# Patient Record
Sex: Female | Born: 1990
Health system: Western US, Academic
[De-identification: ages and names within clinical notes are randomized; demographics above are authoritative.]

## PROBLEM LIST (undated history)

## (undated) DIAGNOSIS — N83209 Unspecified ovarian cyst, unspecified side: Secondary | ICD-10-CM

## (undated) DIAGNOSIS — T7840XA Allergy, unspecified, initial encounter: Secondary | ICD-10-CM

## (undated) HISTORY — PX: TONSILECTOMY, ADENOIDECTOMY, BILATERAL MYRINGOTOMY AND TUBES: SHX2538

## (undated) HISTORY — DX: Allergy, unspecified, initial encounter: T78.40XA

## (undated) HISTORY — DX: Unspecified ovarian cyst, unspecified side: N83.209

---
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
## 2010-02-11 NOTE — Assessment & Plan Note (Signed)
Summary: ? STREP//CCM   Vital Signs:  Patient profile:   20 year old female Menstrual status:  regular Temp:     98.8 degrees F oral Pulse rate:   80 / minute BP sitting:   104 / 60  (left arm) Cuff size:   regular  Vitals Entered By: Gladis Riffle, RN (March 05, 2009 2:18 PM) CC: c/o sore throat intermittent x 1 week, mother has strep Is Patient Diabetic? No Comments also cough, congestion and headache, but throat most worrisome   History of Present Illness: Michele Harding comesin today with mom because of above signs off and on for a week.   Awakens with scratchy sore throast that was betteer in the pm until past day when it became worse.  mom has strep by test  and   brother sick.     No congestion fever or inc ln.   no new rash   . No cough   no NVD   Preventive Screening-Counseling & Management  Alcohol-Tobacco     Alcohol drinks/day: <1     Alcohol type: vodka     Alcohol Counseling: Pt has only 2 nights of drinking in her life     Smoking Status: quit  Current Medications (verified): 1)  Proair Hfa 108 (90 Base) Mcg/act  Aers (Albuterol Sulfate) .Marland Kitchen.. 1-2 Puffs  Qid  As Needed or 1/2 Hour Pre Exercise 2)  Loestrin 24 Fe 1-20 Mg-Mcg Tabs (Norethin Ace-Eth Estrad-Fe) 3)  Fluoxetine Hcl 10 Mg Tabs (Fluoxetine Hcl) .... Take 1 Tablet By Mouth Once A Day  Allergies (verified): No Known Drug Allergies  Past History:  Past medical, surgical, family and social histories (including risk factors) reviewed for relevance to current acute and chronic problems.  Past Medical History: Reviewed history from 09/26/2008 and no changes required. acne EIA no  major injuries  eating problem  Consults Dr. Luanna Cole Dr. Eino Farber  Past Surgical History: Reviewed history from 07/01/2007 and no changes required. Adenoidectomy Tonsillectomy  Family History: Reviewed history from 02/27/2008 and no changes required. Family History of Depression mom  and grandparent  ?  no allergy s   Social History: Reviewed history from 09/26/2008 and no changes required. Not using alcohol Not using substances of abuse [place] [employer]  senioe sleep  7 hours minimal.    drinks milk  hhof 5     Review of Systems       The patient complains of anorexia.  The patient denies fever, weight gain, vision loss, decreased hearing, hoarseness, chest pain, syncope, dyspnea on exertion, prolonged cough, abdominal pain, melena, hematochezia, severe indigestion/heartburn, hematuria, difficulty walking, abnormal bleeding, enlarged lymph nodes, and angioedema.    Physical Exam  General:  Well-developed,well-nourished,in no acute distress; alert,appropriate and cooperative throughout examination washed out but non toxic  Head:  normocephalic and atraumatic.   Eyes:  vision grossly intact, pupils equal, and pupils round.   Ears:  R ear normal, L ear normal, and no external deformities.   Nose:  no external deformity and no external erythema.   Mouth:  very mild erythema and no edema and no exudate   Neck:  shoddy ac nodes but non tender  Lungs:  normal respiratory effort, no intercostal retractions, and no accessory muscle use.   Heart:  normal rate, regular rhythm, no murmur, and no gallop.   Abdomen:  soft and non-tender.   Pulses:  nl cap refill  Extremities:  no clubbing cyanosis or edema  Neurologic:  non focal  grossly Skin:  turgor normal, color normal, no ecchymoses, no petechiae, and no purpura.   Cervical Nodes:  shoddy no anterior cervical adenopathy and no posterior cervical adenopathy.   Psych:  Oriented X3, good eye contact, not anxious appearing, and not depressed appearing.     Impression & Recommendations:  Problem # 1:  PHARYNGITIS, ACUTE (ICD-462) unimpressive exam but high risk exposure  .. disc options or management .  The following medications were removed from the medication list:    Ciprofloxacin Hcl 500 Mg Tabs (Ciprofloxacin hcl) .Marland Kitchen... 1 by  mouth two times a day Her updated medication list for this problem includes:    Amoxicillin 500 Mg Caps (Amoxicillin) .Marland Kitchen... 1  by mouth two times a day for 10 days  Orders: Rapid Strep (21308) T-Culture, Throat (65784-69629)  Complete Medication List: 1)  Proair Hfa 108 (90 Base) Mcg/act Aers (Albuterol sulfate) .Marland Kitchen.. 1-2 puffs  qid  as needed or 1/2 hour pre exercise 2)  Loestrin 24 Fe 1-20 Mg-mcg Tabs (Norethin ace-eth estrad-fe) 3)  Fluoxetine Hcl 10 Mg Tabs (Fluoxetine hcl) .... Take 1 tablet by mouth once a day 4)  Amoxicillin 500 Mg Caps (Amoxicillin) .Marland Kitchen.. 1  by mouth two times a day for 10 days  Patient Instructions: 1)  will notify you of strep test when avaialbe. 2)  If fever increasing red throat and swollen gland in the neck than can start the antibiotic in the meantime. Prescriptions: AMOXICILLIN 500 MG CAPS (AMOXICILLIN) 1  by mouth two times a day for 10 days  #20 x 0   Entered and Authorized by:   Madelin Headings MD   Signed by:   Madelin Headings MD on 03/05/2009   Method used:   Print then Give to Patient   RxID:   [id number]   Laboratory Results    Other Tests  Rapid Strep: negative Comments: Rita Ohara  March 05, 2009 3:05 PM   Kit Test Internal QC: Negative   (Normal Range: Negative)

## 2010-02-11 NOTE — Assessment & Plan Note (Signed)
Summary: tdap, Benedict Needy and varivax/ssc  Nurse Visit   Allergies: No Known Drug Allergies  Immunizations Administered:  Tetanus Vaccine:    Vaccine Type: Tdap    Site: left deltoid    Mfr: GlaxoSmithKline    Dose: 0.5 ml    Route: IM    Given by: Romualdo Bolk, CMA (AAMA)    Exp. Date: 07/12/2011    Lot #: [id number]  Meningococcal Vaccine:    Vaccine Type: Menactra    Site: right deltoid    Mfr: Sanofi Pasteur    Dose: 0.5 ml    Route: IM    Given by: Romualdo Bolk, CMA (AAMA)    Exp. Date: 01/30/2011    Lot #: [id number]  Varicella Vaccine # 2:    Vaccine Type: Varicella    Site: right deltoid    Mfr: Merck    Dose: 0.5 ml    Route: [place]    Given by: Romualdo Bolk, CMA (AAMA)    Exp. Date: 11/14/2010    Lot #: [id number]  Orders Added: 1)  Tdap => 22yrs IM [90715] 2)  Menactra IM [90734] 3)  Admin 1st Vaccine [90471] 4)  Admin of Any Addtl Vaccine [90472] 5)  Varicella  [90716]

## 2010-03-29 LAB — URINE CULTURE: Colony Count: NO GROWTH

## 2010-03-29 LAB — POCT PREGNANCY, URINE: Preg Test, Ur: NEGATIVE

## 2010-03-29 LAB — POCT URINALYSIS DIP (DEVICE)
Bilirubin Urine: NEGATIVE
Ketones, ur: NEGATIVE mg/dL
Nitrite: NEGATIVE
Protein, ur: NEGATIVE mg/dL
pH: 5.5 (ref 5.0–8.0)

## 2010-05-30 NOTE — Op Note (Signed)
[place]. [facility]  Patient:    Michele Harding, Michele Harding                      MRN: [id number] Proc. Date: 06/27/99 Adm. Date:  [id number] Disc. Date: [id number] Attending:  Lucky Cowboy CC:         [facility], Nose and Throat             Dr. Linton Rump Ward             Dr. Lurena Joiner _________, [facility]                           Operative Report  PREOPERATIVE DIAGNOSIS:  Recurrent strep tonsillitis, obstructive sleep apnea.  POSTOPERATIVE DIAGNOSIS:  Recurrent strep tonsillitis, obstructive sleep apnea.  PROCEDURE:  Adenotonsillectomy.  SURGEON:  Lucky Cowboy, M.D.  ANESTHESIA:  General.  ESTIMATED BLOOD LOSS:  30 cc.  SPECIMENS:  Tonsils and adenoids.  COMPLICATIONS:  None.  INDICATIONS:  This patient is an 20-year-old female, who had recurrent tonsillitis for the past six years.  There have been approximately 5-6 episodes of strep tonsillitis per year for the last four years.  There is gagging with eating.  There is a poor energy level during the day.  There is some nasal congestion.  FINDINGS:  The patient was noted to have a moderately severe amount of adenoid hypertrophy with a moderate amount of obstruction of the posterior carina. There were 3+ bilateral palatine tonsils.  PROCEDURE:  The patient was taken to the operating room and placed on the table in the supine position.  She was then placed under general endotracheal anesthesia and the table rotated counterclockwise 90 degrees.  The neck was then gently extended using a shoulder roll.  Bacitracin ointment was placed on the lips.  The head and body were draped.  A Crowe-Davis mouthgag with a #3 tongue blade was then placed intraorally, opened and suspended on the Mayo stand.  Palpation of the soft palate was without evidence of a submucosal cleft. A red rubber catheter was placed down the right nostril, brought out through the oral cavity and secured in place with a hemostat.   Inspection of the nasopharynx was then performed using a mirror.  A medium sized adenoid curet was placed against the vomer, directed inferiorly thus severing the majority of the adenoid pad.  The remainder was removed using St. Clair-Thompson forceps again under indirect visualization.  Two sterile gauze Afrin-soaked packs were placed in the nasopharynx and time allowed for hemostasis.  The palate was relaxed and the right palatine tonsil removed using a Harmonic scalpel lateral to the tonsil in the peritonsillar space.  The left palatine tonsil was removed with a combination of ______  and due to some heating and malfunction of the Harmonic scalpel, the remainder of the inferior portion had to be resected using snare technique and cauterization.  The packs from the nasopharynx were removed and point hemostasis using suction cautery was performed.  An NG tube was then placed down the esophagus for suctioning of the gastric contents.  Mouthgag was removed, noting no damage to the teeth or soft tissues.  The table was rotated clockwise 90 degrees to its original position.  The patient was awakened from anesthesia and extubated in the operating room.  She was taken to the post anesthesia care unit in stable condition.  There were no complications. DD:  07/06/99 TD:  07/07/99 Job:  [id number]

## 2011-03-31 ENCOUNTER — Ambulatory Visit (INDEPENDENT_AMBULATORY_CARE_PROVIDER_SITE_OTHER): Payer: BC Managed Care – PPO | Admitting: Internal Medicine

## 2011-03-31 ENCOUNTER — Other Ambulatory Visit (INDEPENDENT_AMBULATORY_CARE_PROVIDER_SITE_OTHER): Payer: BC Managed Care – PPO

## 2011-03-31 ENCOUNTER — Encounter: Payer: Self-pay | Admitting: Internal Medicine

## 2011-03-31 VITALS — BP 100/70 | HR 84 | Temp 97.9°F | Resp 16 | Ht 66.0 in | Wt 122.0 lb

## 2011-03-31 DIAGNOSIS — H6120 Impacted cerumen, unspecified ear: Secondary | ICD-10-CM

## 2011-03-31 DIAGNOSIS — R5383 Other fatigue: Secondary | ICD-10-CM

## 2011-03-31 DIAGNOSIS — R5381 Other malaise: Secondary | ICD-10-CM

## 2011-03-31 DIAGNOSIS — F988 Other specified behavioral and emotional disorders with onset usually occurring in childhood and adolescence: Secondary | ICD-10-CM

## 2011-03-31 DIAGNOSIS — R1084 Generalized abdominal pain: Secondary | ICD-10-CM

## 2011-03-31 DIAGNOSIS — G478 Other sleep disorders: Secondary | ICD-10-CM

## 2011-03-31 DIAGNOSIS — G479 Sleep disorder, unspecified: Secondary | ICD-10-CM | POA: Insufficient documentation

## 2011-03-31 LAB — URINALYSIS
Leukocytes, UA: NEGATIVE
Nitrite: NEGATIVE
Specific Gravity, Urine: 1.02 (ref 1.000–1.030)
Urine Glucose: NEGATIVE
Urobilinogen, UA: 0.2 (ref 0.0–1.0)

## 2011-03-31 LAB — CBC WITH DIFFERENTIAL/PLATELET
Basophils Absolute: 0 10*3/uL (ref 0.0–0.1)
HCT: 38.1 % (ref 36.0–46.0)
Lymphs Abs: 1.9 10*3/uL (ref 0.7–4.0)
MCV: 91.3 fl (ref 78.0–100.0)
Monocytes Absolute: 0.4 10*3/uL (ref 0.1–1.0)
Platelets: 258 10*3/uL (ref 150.0–400.0)
RDW: 12.7 % (ref 11.5–14.6)

## 2011-03-31 MED ORDER — VITAMIN D 1000 UNITS PO TABS: 1000.0000 [IU] | ORAL_TABLET | Freq: Every day | ORAL | Status: AC

## 2011-03-31 MED ORDER — AMPHETAMINE-DEXTROAMPHETAMINE 10 MG PO TABS: 10.0000 mg | ORAL_TABLET | Freq: Two times a day (BID) | ORAL | Status: DC

## 2011-03-31 NOTE — Progress Notes (Signed)
  Subjective:    Patient ID: Michele Harding, female    DOB: 12-29-1990, 21 y.o.   MRN: [id number]  HPI  New patient C/o poor ability to focus, retain information, take tests. Grades are bad in spite of the effort. C/o sleeping up to 18 h/d x years C/o difficulty falling asleep at night; deep sleep and inability to wake up in am - she has been missing a lot of morning classes   Review of Systems  Constitutional: Negative for fever, chills, diaphoresis, activity change, appetite change, fatigue and unexpected weight change.  HENT: Negative for hearing loss, ear pain, nosebleeds, congestion, sore throat, sneezing, mouth sores, neck pain, dental problem, voice change, postnasal drip, sinus pressure and ear discharge.   Eyes: Negative for pain and visual disturbance.  Respiratory: Negative for cough, chest tightness, wheezing and stridor.   Cardiovascular: Negative for chest pain, palpitations and leg swelling.  Gastrointestinal: Negative for nausea, vomiting, abdominal pain, diarrhea, constipation, blood in stool, abdominal distention and rectal pain.  Genitourinary: Negative for dysuria, hematuria, decreased urine volume, vaginal bleeding, vaginal discharge, difficulty urinating, vaginal pain and menstrual problem.  Musculoskeletal: Negative for back pain, joint swelling and gait problem.  Skin: Negative for color change, pallor, rash and wound.  Neurological: Negative for dizziness, tremors, seizures, syncope, speech difficulty and light-headedness.  Hematological: Negative for adenopathy. Does not bruise/bleed easily.  Psychiatric/Behavioral: Negative for suicidal ideas, hallucinations, behavioral problems, confusion, sleep disturbance, self-injury, dysphoric mood, decreased concentration and agitation. The patient is not nervous/anxious and is not hyperactive.        Objective:   Physical Exam  Constitutional: She appears well-developed and well-nourished. No distress.  HENT:  Head:  Normocephalic.  Nose: Nose normal.  Mouth/Throat: Oropharynx is clear and moist.       B WAX  Eyes: Conjunctivae are normal. Pupils are equal, round, and reactive to light. Right eye exhibits no discharge. Left eye exhibits no discharge. No scleral icterus.  Neck: Normal range of motion. Neck supple. No JVD present. No tracheal deviation present. No thyromegaly present.  Cardiovascular: Normal rate, regular rhythm and normal heart sounds.   No murmur heard. Pulmonary/Chest: No stridor. No respiratory distress. She has no wheezes.  Abdominal: Soft. Bowel sounds are normal. She exhibits no distension and no mass. There is no tenderness. There is no rebound and no guarding.  Musculoskeletal: She exhibits no edema and no tenderness.  Lymphadenopathy:    She has no cervical adenopathy.  Neurological: She displays normal reflexes. No cranial nerve deficit. She exhibits normal muscle tone. Coordination normal.  Skin: No rash noted. She is not diaphoretic. No erythema.  Psychiatric: She has a normal mood and affect. Her behavior is normal. Judgment and thought content normal.     Procedure Note :     Procedure :  Ear irrigation   Indication:  Cerumen impaction   Risks, including pain, dizziness, eardrum perforation, bleeding, infection and others as well as benefits were explained to the patient in detail. Verbal consent was obtained and the patient agreed to proceed.    We used "The [employer] Device" field with lukewarm water for irrigation. A large amount wax was recovered. Procedure has also required manual wax removal with an ear loop.   Tolerated well. Complications: None.   Postprocedure instructions :  Call if problems.        Assessment & Plan:   Will get labs

## 2011-03-31 NOTE — Assessment & Plan Note (Signed)
Chronic  Hypersomnolence  - up to 18 h of sleep at night; problems falling asleep and problems waking up  Aderrall started  Sleep study will be planned later on

## 2011-03-31 NOTE — Assessment & Plan Note (Signed)
Mild - seems to be induced by wellbutrin OK to d/c Wellbutrin

## 2011-03-31 NOTE — Assessment & Plan Note (Signed)
Will irrigate 

## 2011-03-31 NOTE — Assessment & Plan Note (Signed)
Discussed w/pt Start Adderall

## 2011-04-01 ENCOUNTER — Telehealth: Payer: Self-pay | Admitting: Internal Medicine

## 2011-04-01 LAB — BASIC METABOLIC PANEL
Calcium: 9.2 mg/dL (ref 8.4–10.5)
GFR: 140.32 mL/min (ref >60.00)
Glucose, Bld: 86 mg/dL (ref 70–99)
Sodium: 138 mEq/L (ref 135–145)

## 2011-04-01 LAB — VITAMIN B12: Vitamin B-12: 331 pg/mL (ref 211–911)

## 2011-04-01 LAB — HEPATIC FUNCTION PANEL
Albumin: 4 g/dL (ref 3.5–5.2)
Alkaline Phosphatase: 38 U/L — ABNORMAL LOW (ref 39–117)

## 2011-04-01 MED ORDER — VITAMIN B-12 500 MCG SL SUBL: SUBLINGUAL_TABLET | SUBLINGUAL | Status: DC

## 2011-04-01 NOTE — Telephone Encounter (Signed)
Michele Harding, please, inform patient that all labs are normal except for a low end of nl Vit B12 Start Vit B12 sl B12 500 mcg/d Thx

## 2011-04-02 NOTE — Telephone Encounter (Signed)
Pt's mother Corrie Dandy informed.

## 2011-04-09 ENCOUNTER — Telehealth: Payer: Self-pay | Admitting: *Deleted

## 2011-04-09 NOTE — Telephone Encounter (Signed)
Received, completed & faxed back PA form for patient's Adderall 10 mg/SLS

## 2011-04-14 NOTE — Telephone Encounter (Signed)
PA for below is approved 03/19/11-04/08/12.

## 2011-06-01 ENCOUNTER — Ambulatory Visit (INDEPENDENT_AMBULATORY_CARE_PROVIDER_SITE_OTHER): Payer: BC Managed Care – PPO | Admitting: Obstetrics and Gynecology

## 2011-06-01 ENCOUNTER — Encounter: Payer: Self-pay | Admitting: Obstetrics and Gynecology

## 2011-06-01 VITALS — BP 106/60 | Ht 65.75 in | Wt 121.0 lb

## 2011-06-01 DIAGNOSIS — G479 Sleep disorder, unspecified: Secondary | ICD-10-CM

## 2011-06-01 DIAGNOSIS — G478 Other sleep disorders: Secondary | ICD-10-CM

## 2011-06-01 DIAGNOSIS — N926 Irregular menstruation, unspecified: Secondary | ICD-10-CM

## 2011-06-01 DIAGNOSIS — Z202 Contact with and (suspected) exposure to infections with a predominantly sexual mode of transmission: Secondary | ICD-10-CM

## 2011-06-01 MED ORDER — NORETHINDRONE-ETH ESTRADIOL 0.4-35 MG-MCG PO TABS: 1.0000 | ORAL_TABLET | Freq: Every day | ORAL | Status: DC
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
## 2011-06-01 NOTE — Progress Notes (Signed)
Addended by: Lerry Liner D on: 06/01/2011 10:49 AM   Modules accepted: Orders

## 2011-06-01 NOTE — Patient Instructions (Signed)
Oral Contraception Information Oral contraceptives (OCs) are medicines taken to prevent pregnancy, and sometimes to prevent irregular menstrual bleeding. OCs work by preventing the ovaries from releasing eggs. The hormones in OCs also cause the cervical mucus to thicken, preventing the sperm from entering the uterus. The hormones also cause the uterine lining to become thin, not allowing a fertilized egg to attach to the inside of the uterus. OCs are highly effective when taken exactly as prescribed. However, OCs do not prevent sexually transmitted diseases (STDs). Safe sex practices, such as using condoms along with the pill, can help prevent STDs.  Before taking the pill, you may have a physical exam and Pap test. Your caregiver may order blood tests that may be necessary. Your caregiver will make sure you are a good candidate for oral contraception. Discuss with your caregiver the possible side effects of the OC you may be prescribed. When starting an OC, it can take 2 to 3 months for the body to adjust to the changes in hormone levels in your body.  TYPES OF ORAL CONTRACEPTION  The combination pill. This pill contains estrogen and progestin (synthetic progesterone) hormones. The combination pill comes in either 21-day or 28-day packs. With 21-day packs, you do not take pills for 7 days after the last pill. With 28-day packs, the pill is taken every day. The last 7 pills are without hormones. Certain types of pills have more than 21 hormone-containing pills.   The minipill. This pill contains the progesterone hormone only. It is taken every day continuously. The minipill comes in packs of 91 pills. The first 84 pills contain the hormones, and the last 7 pills do not. The last 7 days are when you will have your menstrual period. You may experience irregular spotting.  ADVANTAGES  Decreases premenstrual symptoms.   Treats menstrual period cramps.   Regulates the menstrual cycle.   Decreases a heavy  menstrual flow.   Treats acne.   Treats abnormal uterine bleeding.   Treats chronic pelvic pain.   Treats polycystic ovarian syndrome.   Treats endometriosis.   Can be used as emergency contraception.  DISADVANTAGES OCs can be less effective if:  You forget to take the pill at the same time every day.   You have a stomach or intestinal disease that lessens the absorption of the pill.   You take OCs with other medicines that make OCs less effective.   You take expired OCs.   You forget to restart the pill on day 7, when using the packs of 21 pills.  Document Released: 03/21/2002 Document Revised: 12/18/2010 Document Reviewed: 05/07/2010 [facility] Patient Information 2012 [place], [place].

## 2011-06-02 LAB — GC/CHLAMYDIA PROBE AMP, GENITAL: Chlamydia, DNA Probe: NEGATIVE

## 2011-06-05 ENCOUNTER — Encounter: Payer: Self-pay | Admitting: Internal Medicine

## 2011-06-05 ENCOUNTER — Ambulatory Visit (INDEPENDENT_AMBULATORY_CARE_PROVIDER_SITE_OTHER): Payer: BC Managed Care – PPO | Admitting: Internal Medicine

## 2011-06-05 VITALS — BP 114/70 | HR 71 | Temp 98.5°F | Resp 16 | Wt 120.0 lb

## 2011-06-05 DIAGNOSIS — F988 Other specified behavioral and emotional disorders with onset usually occurring in childhood and adolescence: Secondary | ICD-10-CM

## 2011-06-05 DIAGNOSIS — R5383 Other fatigue: Secondary | ICD-10-CM

## 2011-06-05 DIAGNOSIS — G478 Other sleep disorders: Secondary | ICD-10-CM

## 2011-06-05 DIAGNOSIS — G479 Sleep disorder, unspecified: Secondary | ICD-10-CM

## 2011-06-05 DIAGNOSIS — R5381 Other malaise: Secondary | ICD-10-CM

## 2011-06-05 MED ORDER — AMPHETAMINE-DEXTROAMPHETAMINE 10 MG PO TABS: ORAL_TABLET | ORAL | Status: DC

## 2011-06-05 NOTE — Assessment & Plan Note (Signed)
Chronic  Hypersomnolence  - up to 18 h of sleep at night; problems falling asleep and problems waking up  06/01/11, now improved, doing well, sleeping 7-8/h per 24h

## 2011-06-05 NOTE — Assessment & Plan Note (Signed)
Better - will increase Adderall  Rx up to 30 mg/d

## 2011-06-05 NOTE — Assessment & Plan Note (Signed)
Much better 

## 2011-06-05 NOTE — Progress Notes (Signed)
Patient ID: Michele Harding, female   DOB: [birth year], 21 y.o.   MRN: [id number]  Subjective:    Patient ID: Michele Harding, female    DOB: 05/28/1990, 21 y.o.   MRN: [id number]  HPI   F/u poor ability to focus, retain information, take tests. Much better now F/u sleeping up to 18 h/d x years  - better now F/u difficulty falling asleep at night; deep sleep and inability to wake up in am  BP Readings from Last 3 Encounters:  06/05/11 114/70  06/01/11 106/60  03/31/11 100/70   Wt Readings from Last 3 Encounters:  06/05/11 120 lb (54.432 kg)  06/01/11 121 lb (54.885 kg)  03/31/11 122 lb (55.339 kg)      Review of Systems  Constitutional: Negative for fever, chills, diaphoresis, activity change, appetite change, fatigue and unexpected weight change.  HENT: Negative for hearing loss, ear pain, nosebleeds, congestion, sore throat, sneezing, mouth sores, neck pain, dental problem, voice change, postnasal drip, sinus pressure and ear discharge.   Eyes: Negative for pain and visual disturbance.  Respiratory: Negative for cough, chest tightness, wheezing and stridor.   Cardiovascular: Negative for chest pain, palpitations and leg swelling.  Gastrointestinal: Negative for nausea, vomiting, abdominal pain, diarrhea, constipation, blood in stool, abdominal distention and rectal pain.  Genitourinary: Negative for dysuria, hematuria, decreased urine volume, vaginal bleeding, vaginal discharge, difficulty urinating, vaginal pain and menstrual problem.  Musculoskeletal: Negative for back pain, joint swelling and gait problem.  Skin: Negative for color change, pallor, rash and wound.  Neurological: Negative for dizziness, tremors, seizures, syncope, speech difficulty and light-headedness.  Hematological: Negative for adenopathy. Does not bruise/bleed easily.  Psychiatric/Behavioral: Negative for suicidal ideas, hallucinations, behavioral problems, confusion, sleep disturbance, self-injury,  dysphoric mood, decreased concentration and agitation. The patient is not nervous/anxious and is not hyperactive.        Objective:   Physical Exam  Constitutional: She appears well-developed and well-nourished. No distress.  HENT:  Head: Normocephalic.  Nose: Nose normal.  Mouth/Throat: Oropharynx is clear and moist.  Eyes: Conjunctivae are normal. Pupils are equal, round, and reactive to light. Right eye exhibits no discharge. Left eye exhibits no discharge. No scleral icterus.  Neck: Normal range of motion. Neck supple. No JVD present. No tracheal deviation present. No thyromegaly present.  Cardiovascular: Normal rate, regular rhythm and normal heart sounds.   No murmur heard. Pulmonary/Chest: No stridor. No respiratory distress. She has no wheezes.  Abdominal: Soft. Bowel sounds are normal. She exhibits no distension and no mass. There is no tenderness. There is no rebound and no guarding.  Musculoskeletal: She exhibits no edema and no tenderness.  Lymphadenopathy:    She has no cervical adenopathy.  Neurological: She displays normal reflexes. No cranial nerve deficit. She exhibits normal muscle tone. Coordination normal.  Skin: No rash noted. She is not diaphoretic. No erythema.  Psychiatric: She has a normal mood and affect. Her behavior is normal. Judgment and thought content normal.      Lab Results  Component Value Date   WBC 6.3 03/31/2011   HGB 13.1 03/31/2011   HCT 38.1 03/31/2011   PLT 258.0 03/31/2011   GLUCOSE 86 03/31/2011   CHOL 166 03/23/2008   TRIG 70 03/23/2008   HDL 52.8 03/23/2008   LDLCALC 99 03/23/2008   ALT 15 03/31/2011   AST 16 03/31/2011   NA 138 03/31/2011   K 4.6 03/31/2011   CL 104 03/31/2011   CREATININE 0.6 03/31/2011   BUN 15  03/31/2011   CO2 27 03/31/2011   TSH 0.50 03/31/2011          Assessment & Plan:

## 2011-06-26 ENCOUNTER — Telehealth: Payer: Self-pay

## 2011-06-26 NOTE — Telephone Encounter (Signed)
Pt's father called requesting refill of Adderall for pt.

## 2011-06-29 NOTE — Telephone Encounter (Signed)
She was given two Rx  On 5/24th - one for June to fill on June 24 th and one for July to fill on Jul 24th. Does she have them? Thx

## 2011-06-30 NOTE — Telephone Encounter (Signed)
Called pt and father- no answer/unable to leave message.

## 2011-07-01 NOTE — Telephone Encounter (Signed)
I spoke to pt- she states she did lose the Rxs I advised her that Dr. Posey Rea is ok with replacing the Rxs.I advised her that I will get one of his partners to sign them this afternoon when I return to [place] office and call her mother.

## 2011-07-01 NOTE — Telephone Encounter (Signed)
I called and spoke to mother. She is unsure if pt lost them. She uses [facility] on [address] In [place] and wants me to contact them to see if the Rx is on file.

## 2011-07-02 MED ORDER — AMPHETAMINE-DEXTROAMPHETAMINE 10 MG PO TABS: ORAL_TABLET | ORAL | Status: DC

## 2011-07-02 NOTE — Telephone Encounter (Signed)
done

## 2011-07-02 NOTE — Telephone Encounter (Signed)
Pt's mother informed Rx upfront.

## 2011-08-04 ENCOUNTER — Ambulatory Visit (INDEPENDENT_AMBULATORY_CARE_PROVIDER_SITE_OTHER): Payer: BC Managed Care – PPO | Admitting: Internal Medicine

## 2011-08-04 ENCOUNTER — Encounter: Payer: Self-pay | Admitting: Internal Medicine

## 2011-08-04 VITALS — BP 118/78 | HR 80 | Temp 98.5°F | Resp 16 | Wt 122.0 lb

## 2011-08-04 DIAGNOSIS — R5383 Other fatigue: Secondary | ICD-10-CM

## 2011-08-04 DIAGNOSIS — G479 Sleep disorder, unspecified: Secondary | ICD-10-CM

## 2011-08-04 DIAGNOSIS — F988 Other specified behavioral and emotional disorders with onset usually occurring in childhood and adolescence: Secondary | ICD-10-CM

## 2011-08-04 DIAGNOSIS — R5381 Other malaise: Secondary | ICD-10-CM

## 2011-08-04 DIAGNOSIS — E559 Vitamin D deficiency, unspecified: Secondary | ICD-10-CM

## 2011-08-04 DIAGNOSIS — J45909 Unspecified asthma, uncomplicated: Secondary | ICD-10-CM | POA: Insufficient documentation

## 2011-08-04 DIAGNOSIS — E538 Deficiency of other specified B group vitamins: Secondary | ICD-10-CM

## 2011-08-04 MED ORDER — BUDESONIDE-FORMOTEROL FUMARATE 160-4.5 MCG/ACT IN AERO: 2.0000 | INHALATION_SPRAY | Freq: Two times a day (BID) | RESPIRATORY_TRACT | Status: AC

## 2011-08-04 MED ORDER — AMPHETAMINE-DEXTROAMPHETAMINE 10 MG PO TABS: ORAL_TABLET | ORAL | Status: DC

## 2011-08-04 MED ORDER — ALBUTEROL SULFATE HFA 108 (90 BASE) MCG/ACT IN AERS: 1.0000 | INHALATION_SPRAY | RESPIRATORY_TRACT | Status: DC | PRN

## 2011-08-04 NOTE — Assessment & Plan Note (Signed)
See meds 

## 2011-08-04 NOTE — Assessment & Plan Note (Signed)
Continue with current prescription therapy as reflected on the Med list. Labs  

## 2011-08-04 NOTE — Assessment & Plan Note (Signed)
Better Continue with current prescription therapy as reflected on the Med list.  

## 2011-08-04 NOTE — Assessment & Plan Note (Signed)
Continue with current prescription therapy as reflected on the Med list.  

## 2011-08-04 NOTE — Progress Notes (Signed)
Subjective:    Patient ID: Michele Harding, female    DOB: 1990-08-30, 20 y.o.   MRN: [id number]  HPI   F/u poor ability to focus, retain information, take tests. Much better now F/u sleep disorder (was sleeping 18 h/d) F/u difficulty falling asleep at night; deep sleep and inability to wake up in am F/u asthma, low B12 and low Vit D   BP Readings from Last 3 Encounters:  08/04/11 118/78  06/05/11 114/70  06/01/11 106/60   Wt Readings from Last 3 Encounters:  08/04/11 122 lb (55.339 kg)  06/05/11 120 lb (54.432 kg)  06/01/11 121 lb (54.885 kg)      Review of Systems  Constitutional: Negative for fever, chills, diaphoresis, activity change, appetite change, fatigue and unexpected weight change.  HENT: Negative for hearing loss, ear pain, nosebleeds, congestion, sore throat, sneezing, mouth sores, neck pain, dental problem, voice change, postnasal drip, sinus pressure and ear discharge.   Eyes: Negative for pain and visual disturbance.  Respiratory: Negative for cough, chest tightness, wheezing and stridor.   Cardiovascular: Negative for chest pain, palpitations and leg swelling.  Gastrointestinal: Negative for nausea, vomiting, abdominal pain, diarrhea, constipation, blood in stool, abdominal distention and rectal pain.  Genitourinary: Negative for dysuria, hematuria, decreased urine volume, vaginal bleeding, vaginal discharge, difficulty urinating, vaginal pain and menstrual problem.  Musculoskeletal: Negative for back pain, joint swelling and gait problem.  Skin: Negative for color change, pallor, rash and wound.  Neurological: Negative for dizziness, tremors, seizures, syncope, speech difficulty and light-headedness.  Hematological: Negative for adenopathy. Does not bruise/bleed easily.  Psychiatric/Behavioral: Negative for suicidal ideas, hallucinations, behavioral problems, confusion, disturbed wake/sleep cycle, self-injury, dysphoric mood, decreased concentration and  agitation. The patient is not nervous/anxious and is not hyperactive.        Objective:   Physical Exam  Constitutional: She appears well-developed and well-nourished. No distress.  HENT:  Head: Normocephalic.  Nose: Nose normal.  Mouth/Throat: Oropharynx is clear and moist.  Eyes: Conjunctivae are normal. Pupils are equal, round, and reactive to light. Right eye exhibits no discharge. Left eye exhibits no discharge. No scleral icterus.  Neck: Normal range of motion. Neck supple. No JVD present. No tracheal deviation present. No thyromegaly present.  Cardiovascular: Normal rate, regular rhythm and normal heart sounds.   No murmur heard. Pulmonary/Chest: No stridor. No respiratory distress. She has no wheezes.  Abdominal: Soft. Bowel sounds are normal. She exhibits no distension and no mass. There is no tenderness. There is no rebound and no guarding.  Musculoskeletal: She exhibits no edema and no tenderness.  Lymphadenopathy:    She has no cervical adenopathy.  Neurological: She displays normal reflexes. No cranial nerve deficit. She exhibits normal muscle tone. Coordination normal.  Skin: No rash noted. She is not diaphoretic. No erythema.  Psychiatric: She has a normal mood and affect. Her behavior is normal. Judgment and thought content normal.      Lab Results  Component Value Date   WBC 6.3 03/31/2011   HGB 13.1 03/31/2011   HCT 38.1 03/31/2011   PLT 258.0 03/31/2011   GLUCOSE 86 03/31/2011   CHOL 166 03/23/2008   TRIG 70 03/23/2008   HDL 52.8 03/23/2008   LDLCALC 99 03/23/2008   ALT 15 03/31/2011   AST 16 03/31/2011   NA 138 03/31/2011   K 4.6 03/31/2011   CL 104 03/31/2011   CREATININE 0.6 03/31/2011   BUN 15 03/31/2011   CO2 27 03/31/2011   TSH 0.50 03/31/2011  Assessment & Plan:

## 2011-08-04 NOTE — Assessment & Plan Note (Signed)
Doing well 

## 2011-08-04 NOTE — Assessment & Plan Note (Signed)
Better  

## 2011-09-15 ENCOUNTER — Ambulatory Visit: Payer: BC Managed Care – PPO | Admitting: Internal Medicine

## 2011-10-21 ENCOUNTER — Ambulatory Visit (INDEPENDENT_AMBULATORY_CARE_PROVIDER_SITE_OTHER): Payer: BC Managed Care – PPO | Admitting: Internal Medicine

## 2011-10-21 ENCOUNTER — Encounter: Payer: Self-pay | Admitting: Internal Medicine

## 2011-10-21 ENCOUNTER — Other Ambulatory Visit (INDEPENDENT_AMBULATORY_CARE_PROVIDER_SITE_OTHER): Payer: BC Managed Care – PPO

## 2011-10-21 VITALS — BP 100/80 | HR 80 | Temp 98.5°F | Resp 16 | Wt 125.1 lb

## 2011-10-21 DIAGNOSIS — Z23 Encounter for immunization: Secondary | ICD-10-CM

## 2011-10-21 DIAGNOSIS — J45909 Unspecified asthma, uncomplicated: Secondary | ICD-10-CM

## 2011-10-21 DIAGNOSIS — E538 Deficiency of other specified B group vitamins: Secondary | ICD-10-CM

## 2011-10-21 DIAGNOSIS — Z733 Stress, not elsewhere classified: Secondary | ICD-10-CM

## 2011-10-21 DIAGNOSIS — E559 Vitamin D deficiency, unspecified: Secondary | ICD-10-CM

## 2011-10-21 DIAGNOSIS — F419 Anxiety disorder, unspecified: Secondary | ICD-10-CM

## 2011-10-21 DIAGNOSIS — F411 Generalized anxiety disorder: Secondary | ICD-10-CM

## 2011-10-21 DIAGNOSIS — F988 Other specified behavioral and emotional disorders with onset usually occurring in childhood and adolescence: Secondary | ICD-10-CM

## 2011-10-21 DIAGNOSIS — F439 Reaction to severe stress, unspecified: Secondary | ICD-10-CM

## 2011-10-21 DIAGNOSIS — G479 Sleep disorder, unspecified: Secondary | ICD-10-CM

## 2011-10-21 MED ORDER — AMPHETAMINE-DEXTROAMPHETAMINE 10 MG PO TABS: ORAL_TABLET | ORAL | Status: DC

## 2011-10-21 MED ORDER — ALBUTEROL SULFATE HFA 108 (90 BASE) MCG/ACT IN AERS: 1.0000 | INHALATION_SPRAY | RESPIRATORY_TRACT | Status: DC | PRN

## 2011-10-21 MED ORDER — VITAMIN B-12 500 MCG SL SUBL: SUBLINGUAL_TABLET | SUBLINGUAL | Status: AC

## 2011-10-21 MED ORDER — BUSPIRONE HCL 15 MG PO TABS: 15.0000 mg | ORAL_TABLET | Freq: Two times a day (BID) | ORAL | Status: DC

## 2011-10-21 NOTE — Progress Notes (Signed)
Subjective:    Patient ID: Michele Harding, female    DOB: 07/28/1990, 21 y.o.   MRN: [id number]  HPI  C/o anxiety and stress - her parents have moved to [place] and she was working too much - she dropped out...- will re-start in Jan F/u poor ability to focus, retain information, take tests. Much better now F/u sleep disorder (she used to be sleeping 18 h/d) -much better F/u difficulty falling asleep at night; deep sleep and inability to wake up in am F/u asthma, low B12 and low Vit D   BP Readings from Last 3 Encounters:  10/21/11 100/80  08/04/11 118/78  06/05/11 114/70   Wt Readings from Last 3 Encounters:  10/21/11 125 lb 2 oz (56.756 kg)  08/04/11 122 lb (55.339 kg)  06/05/11 120 lb (54.432 kg)      Review of Systems  Constitutional: Negative for fever, chills, diaphoresis, activity change, appetite change, fatigue and unexpected weight change.  HENT: Negative for hearing loss, ear pain, nosebleeds, congestion, sore throat, sneezing, mouth sores, neck pain, dental problem, voice change, postnasal drip, sinus pressure and ear discharge.   Eyes: Negative for pain and visual disturbance.  Respiratory: Negative for cough, chest tightness, wheezing and stridor.   Cardiovascular: Negative for chest pain, palpitations and leg swelling.  Gastrointestinal: Negative for nausea, vomiting, abdominal pain, diarrhea, constipation, blood in stool, abdominal distention and rectal pain.  Genitourinary: Negative for dysuria, hematuria, decreased urine volume, vaginal bleeding, vaginal discharge, difficulty urinating, vaginal pain and menstrual problem.  Musculoskeletal: Negative for back pain, joint swelling and gait problem.  Skin: Negative for color change, pallor, rash and wound.  Neurological: Negative for dizziness, tremors, seizures, syncope, speech difficulty and light-headedness.  Hematological: Negative for adenopathy. Does not bruise/bleed easily.  Psychiatric/Behavioral:  Negative for suicidal ideas, hallucinations, behavioral problems, confusion, disturbed wake/sleep cycle, self-injury, dysphoric mood, decreased concentration and agitation. The patient is not nervous/anxious and is not hyperactive.        Objective:   Physical Exam  Constitutional: She appears well-developed and well-nourished. No distress.  HENT:  Head: Normocephalic.  Nose: Nose normal.  Mouth/Throat: Oropharynx is clear and moist.  Eyes: Conjunctivae normal are normal. Pupils are equal, round, and reactive to light. Right eye exhibits no discharge. Left eye exhibits no discharge. No scleral icterus.  Neck: Normal range of motion. Neck supple. No JVD present. No tracheal deviation present. No thyromegaly present.  Cardiovascular: Normal rate, regular rhythm and normal heart sounds.   No murmur heard. Pulmonary/Chest: No stridor. No respiratory distress. She has no wheezes.  Abdominal: Soft. Bowel sounds are normal. She exhibits no distension and no mass. There is no tenderness. There is no rebound and no guarding.  Musculoskeletal: She exhibits no edema and no tenderness.  Lymphadenopathy:    She has no cervical adenopathy.  Neurological: She displays normal reflexes. No cranial nerve deficit. She exhibits normal muscle tone. Coordination normal.  Skin: No rash noted. She is not diaphoretic. No erythema.  Psychiatric: She has a normal mood and affect. Her behavior is normal. Judgment and thought content normal.      Lab Results  Component Value Date   WBC 6.3 03/31/2011   HGB 13.1 03/31/2011   HCT 38.1 03/31/2011   PLT 258.0 03/31/2011   GLUCOSE 86 03/31/2011   CHOL 166 03/23/2008   TRIG 70 03/23/2008   HDL 52.8 03/23/2008   LDLCALC 99 03/23/2008   ALT 15 03/31/2011   AST 16 03/31/2011   NA  138 03/31/2011   K 4.6 03/31/2011   CL 104 03/31/2011   CREATININE 0.6 03/31/2011   BUN 15 03/31/2011   CO2 27 03/31/2011   TSH 0.50 03/31/2011          Assessment & Plan:

## 2011-10-21 NOTE — Assessment & Plan Note (Signed)
Continue with current prescription therapy as reflected on the Med list.  

## 2011-10-21 NOTE — Assessment & Plan Note (Signed)
Allergy tests 

## 2011-10-22 DIAGNOSIS — F419 Anxiety disorder, unspecified: Secondary | ICD-10-CM | POA: Insufficient documentation

## 2011-10-22 DIAGNOSIS — F439 Reaction to severe stress, unspecified: Secondary | ICD-10-CM | POA: Insufficient documentation

## 2011-10-22 LAB — ALLERGEN FOOD PROFILE SPECIFIC IGE
Apple: <0.1 kU/L
Chicken IgE: <0.1 kU/L
Corn: <0.1 kU/L
Egg White IgE: <0.1 kU/L
Fish Cod: <0.1 kU/L
IgE (Immunoglobulin E), Serum: 4.6 IU/mL (ref 0.0–180.0)
Milk IgE: <0.1 kU/L
Orange: <0.1 kU/L
Peanut IgE: <0.1 kU/L
Shrimp IgE: <0.1 kU/L
Soybean IgE: <0.1 kU/L
Tomato IgE: <0.1 kU/L
Tuna IgE: <0.1 kU/L
Wheat IgE: <0.1 kU/L

## 2011-10-22 LAB — VITAMIN D 25 HYDROXY (VIT D DEFICIENCY, FRACTURES): Vit D, 25-Hydroxy: 60 ng/mL (ref 30–89)

## 2011-10-22 LAB — VITAMIN B12: Vitamin B-12: 548 pg/mL (ref 211–911)

## 2011-10-22 NOTE — Assessment & Plan Note (Signed)
Discussed. She is seeing a Veterinary surgeon.

## 2011-10-22 NOTE — Assessment & Plan Note (Signed)
Buspar  bid 

## 2011-10-27 LAB — IGG FOOD PANEL
Beef, IgG: 6.1 ug/mL — ABNORMAL HIGH (ref <2.0)
Egg yolk, IgG: <2 ug/mL (ref <2.0)
Wheat, IgG: 0.18 ug/mL — ABNORMAL HIGH (ref <0.15)

## 2012-01-25 ENCOUNTER — Ambulatory Visit: Payer: BC Managed Care – PPO | Admitting: Internal Medicine

## 2012-01-27 ENCOUNTER — Ambulatory Visit: Payer: BC Managed Care – PPO | Admitting: Internal Medicine

## 2012-02-04 ENCOUNTER — Ambulatory Visit: Payer: BC Managed Care – PPO | Admitting: Internal Medicine

## 2012-02-09 ENCOUNTER — Ambulatory Visit: Payer: BC Managed Care – PPO | Admitting: Internal Medicine

## 2012-02-18 ENCOUNTER — Encounter: Payer: Self-pay | Admitting: Internal Medicine

## 2012-02-18 ENCOUNTER — Ambulatory Visit (INDEPENDENT_AMBULATORY_CARE_PROVIDER_SITE_OTHER): Payer: BC Managed Care – PPO | Admitting: Internal Medicine

## 2012-02-18 VITALS — BP 118/80 | HR 88 | Temp 98.3°F | Resp 16 | Wt 118.0 lb

## 2012-02-18 DIAGNOSIS — F411 Generalized anxiety disorder: Secondary | ICD-10-CM

## 2012-02-18 DIAGNOSIS — F439 Reaction to severe stress, unspecified: Secondary | ICD-10-CM

## 2012-02-18 DIAGNOSIS — E538 Deficiency of other specified B group vitamins: Secondary | ICD-10-CM

## 2012-02-18 DIAGNOSIS — F419 Anxiety disorder, unspecified: Secondary | ICD-10-CM

## 2012-02-18 DIAGNOSIS — F988 Other specified behavioral and emotional disorders with onset usually occurring in childhood and adolescence: Secondary | ICD-10-CM

## 2012-02-18 DIAGNOSIS — Z733 Stress, not elsewhere classified: Secondary | ICD-10-CM

## 2012-02-18 MED ORDER — AMPHETAMINE-DEXTROAMPHET ER 20 MG PO CP24: 20.0000 mg | ORAL_CAPSULE | ORAL | Status: DC

## 2012-02-18 MED ORDER — AMPHETAMINE-DEXTROAMPHETAMINE 10 MG PO TABS: ORAL_TABLET | ORAL | Status: DC

## 2012-02-18 MED ORDER — BUSPIRONE HCL 15 MG PO TABS: 15.0000 mg | ORAL_TABLET | Freq: Two times a day (BID) | ORAL | Status: DC

## 2012-02-18 NOTE — Assessment & Plan Note (Signed)
Change to Adderral XR in am and reg Adderral at lunch ( if needed only) to avoid a "fatigue gap"

## 2012-02-18 NOTE — Progress Notes (Signed)
Subjective:    HPI  C/o anxiety and stress. She re-start in Jan F/u poor ability to focus, retain information, take tests. Much better now. C/o a profound fatigue gap after lunch x 1-2 hrs long... F/u sleep disorder (she used to be sleeping 18 h/d) -much better F/u difficulty falling asleep at night; deep sleep and inability to wake up in am F/u asthma, low B12 and low Vit D   BP Readings from Last 3 Encounters:  02/18/12 118/80  10/21/11 100/80  08/04/11 118/78   Wt Readings from Last 3 Encounters:  02/18/12 118 lb (53.524 kg)  10/21/11 125 lb 2 oz (56.756 kg)  08/04/11 122 lb (55.339 kg)      Review of Systems  Constitutional: Negative for fever, chills, diaphoresis, activity change, appetite change, fatigue and unexpected weight change.  HENT: Negative for hearing loss, ear pain, nosebleeds, congestion, sore throat, sneezing, mouth sores, neck pain, dental problem, voice change, postnasal drip, sinus pressure and ear discharge.   Eyes: Negative for pain and visual disturbance.  Respiratory: Negative for cough, chest tightness, wheezing and stridor.   Cardiovascular: Negative for chest pain, palpitations and leg swelling.  Gastrointestinal: Negative for nausea, vomiting, abdominal pain, diarrhea, constipation, blood in stool, abdominal distention and rectal pain.  Genitourinary: Negative for dysuria, hematuria, decreased urine volume, vaginal bleeding, vaginal discharge, difficulty urinating, vaginal pain and menstrual problem.  Musculoskeletal: Negative for back pain, joint swelling and gait problem.  Skin: Negative for color change, pallor, rash and wound.  Neurological: Negative for dizziness, tremors, seizures, syncope, speech difficulty and light-headedness.  Hematological: Negative for adenopathy. Does not bruise/bleed easily.  Psychiatric/Behavioral: Negative for suicidal ideas, hallucinations, behavioral problems, confusion, sleep disturbance, self-injury, dysphoric  mood, decreased concentration and agitation. The patient is not nervous/anxious and is not hyperactive.        Objective:   Physical Exam  Constitutional: She appears well-developed and well-nourished. No distress.  HENT:  Head: Normocephalic.  Nose: Nose normal.  Mouth/Throat: Oropharynx is clear and moist.  Eyes: Conjunctivae normal are normal. Pupils are equal, round, and reactive to light. Right eye exhibits no discharge. Left eye exhibits no discharge. No scleral icterus.  Neck: Normal range of motion. Neck supple. No JVD present. No tracheal deviation present. No thyromegaly present.  Cardiovascular: Normal rate, regular rhythm and normal heart sounds.   No murmur heard. Pulmonary/Chest: No stridor. No respiratory distress. She has no wheezes.  Abdominal: Soft. Bowel sounds are normal. She exhibits no distension and no mass. There is no tenderness. There is no rebound and no guarding.  Musculoskeletal: She exhibits no edema and no tenderness.  Lymphadenopathy:    She has no cervical adenopathy.  Neurological: She displays normal reflexes. No cranial nerve deficit. She exhibits normal muscle tone. Coordination normal.  Skin: No rash noted. She is not diaphoretic. No erythema.  Psychiatric: She has a normal mood and affect. Her behavior is normal. Judgment and thought content normal.      Lab Results  Component Value Date   WBC 6.3 03/31/2011   HGB 13.1 03/31/2011   HCT 38.1 03/31/2011   PLT 258.0 03/31/2011   GLUCOSE 86 03/31/2011   CHOL 166 03/23/2008   TRIG 70 03/23/2008   HDL 52.8 03/23/2008   LDLCALC 99 03/23/2008   ALT 15 03/31/2011   AST 16 03/31/2011   NA 138 03/31/2011   K 4.6 03/31/2011   CL 104 03/31/2011   CREATININE 0.6 03/31/2011   BUN 15 03/31/2011   CO2  27 03/31/2011   TSH 0.50 03/31/2011          Assessment & Plan:

## 2012-02-18 NOTE — Assessment & Plan Note (Signed)
Better She re-started school Working very little this semester

## 2012-02-18 NOTE — Assessment & Plan Note (Signed)
Continue with current prescription therapy as reflected on the Med list.  

## 2012-02-18 NOTE — Assessment & Plan Note (Signed)
Better Continue with current prescription therapy as reflected on the Med list.  
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
## 2012-05-19 ENCOUNTER — Ambulatory Visit: Payer: BC Managed Care – PPO | Admitting: Internal Medicine

## 2012-05-27 ENCOUNTER — Encounter: Payer: Self-pay | Admitting: Internal Medicine

## 2012-05-27 ENCOUNTER — Ambulatory Visit (INDEPENDENT_AMBULATORY_CARE_PROVIDER_SITE_OTHER): Payer: BC Managed Care – PPO | Admitting: Internal Medicine

## 2012-05-27 ENCOUNTER — Ambulatory Visit: Payer: BC Managed Care – PPO | Admitting: Internal Medicine

## 2012-05-27 VITALS — BP 110/80 | HR 80 | Temp 98.6°F | Resp 16 | Wt 116.0 lb

## 2012-05-27 DIAGNOSIS — F988 Other specified behavioral and emotional disorders with onset usually occurring in childhood and adolescence: Secondary | ICD-10-CM

## 2012-05-27 DIAGNOSIS — E559 Vitamin D deficiency, unspecified: Secondary | ICD-10-CM

## 2012-05-27 DIAGNOSIS — E538 Deficiency of other specified B group vitamins: Secondary | ICD-10-CM

## 2012-05-27 MED ORDER — AMPHETAMINE-DEXTROAMPHETAMINE 10 MG PO TABS: ORAL_TABLET | ORAL | Status: DC

## 2012-05-27 MED ORDER — NORETHINDRONE-ETH ESTRADIOL 0.4-35 MG-MCG PO TABS: 1.0000 | ORAL_TABLET | Freq: Every day | ORAL | Status: AC

## 2012-05-27 NOTE — Progress Notes (Signed)
Subjective:    HPI  F/u ADD F/u anxiety and stress - better F/u poor ability to focus, retain information, take tests. Much better now.  F/u sleep disorder (she used to be sleeping 18 h/d) -much better F/u difficulty falling asleep at night; deep sleep and inability to wake up in am F/u asthma, low B12 and low Vit D - doing ok   BP Readings from Last 3 Encounters:  05/27/12 110/80  02/18/12 118/80  10/21/11 100/80   Wt Readings from Last 3 Encounters:  05/27/12 116 lb (52.617 kg)  02/18/12 118 lb (53.524 kg)  10/21/11 125 lb 2 oz (56.756 kg)      Review of Systems  Constitutional: Negative for fever, chills, diaphoresis, activity change, appetite change, fatigue and unexpected weight change.  HENT: Negative for hearing loss, ear pain, nosebleeds, congestion, sore throat, sneezing, mouth sores, neck pain, dental problem, voice change, postnasal drip, sinus pressure and ear discharge.   Eyes: Negative for pain and visual disturbance.  Respiratory: Negative for cough, chest tightness, wheezing and stridor.   Cardiovascular: Negative for chest pain, palpitations and leg swelling.  Gastrointestinal: Negative for nausea, vomiting, abdominal pain, diarrhea, constipation, blood in stool, abdominal distention and rectal pain.  Genitourinary: Negative for dysuria, hematuria, decreased urine volume, vaginal bleeding, vaginal discharge, difficulty urinating, vaginal pain and menstrual problem.  Musculoskeletal: Negative for back pain, joint swelling and gait problem.  Skin: Negative for color change, pallor, rash and wound.  Neurological: Negative for dizziness, tremors, seizures, syncope, speech difficulty and light-headedness.  Hematological: Negative for adenopathy. Does not bruise/bleed easily.  Psychiatric/Behavioral: Negative for suicidal ideas, hallucinations, behavioral problems, confusion, sleep disturbance, self-injury, dysphoric mood, decreased concentration and agitation.  The patient is not nervous/anxious and is not hyperactive.        Objective:   Physical Exam  Constitutional: She appears well-developed and well-nourished. No distress.  HENT:  Head: Normocephalic.  Nose: Nose normal.  Mouth/Throat: Oropharynx is clear and moist.  Eyes: Conjunctivae are normal. Pupils are equal, round, and reactive to light. Right eye exhibits no discharge. Left eye exhibits no discharge. No scleral icterus.  Neck: Normal range of motion. Neck supple. No JVD present. No tracheal deviation present. No thyromegaly present.  Cardiovascular: Normal rate, regular rhythm and normal heart sounds.   No murmur heard. Pulmonary/Chest: No stridor. No respiratory distress. She has no wheezes.  Abdominal: Soft. Bowel sounds are normal. She exhibits no distension and no mass. There is no tenderness. There is no rebound and no guarding.  Musculoskeletal: She exhibits no edema and no tenderness.  Lymphadenopathy:    She has no cervical adenopathy.  Neurological: She displays normal reflexes. No cranial nerve deficit. She exhibits normal muscle tone. Coordination normal.  Skin: No rash noted. She is not diaphoretic. No erythema.  Psychiatric: She has a normal mood and affect. Her behavior is normal. Judgment and thought content normal.      Lab Results  Component Value Date   WBC 6.3 03/31/2011   HGB 13.1 03/31/2011   HCT 38.1 03/31/2011   PLT 258.0 03/31/2011   GLUCOSE 86 03/31/2011   CHOL 166 03/23/2008   TRIG 70 03/23/2008   HDL 52.8 03/23/2008   LDLCALC 99 03/23/2008   ALT 15 03/31/2011   AST 16 03/31/2011   NA 138 03/31/2011   K 4.6 03/31/2011   CL 104 03/31/2011   CREATININE 0.6 03/31/2011   BUN 15 03/31/2011   CO2 27 03/31/2011   TSH 0.50 03/31/2011  Assessment & Plan:

## 2012-05-29 NOTE — Assessment & Plan Note (Signed)
Continue with current prescription therapy as reflected on the Med list.  

## 2012-05-29 NOTE — Assessment & Plan Note (Signed)
Will change back to regular Adderall due to anxiety

## 2012-08-29 ENCOUNTER — Ambulatory Visit: Payer: BC Managed Care – PPO | Admitting: Internal Medicine

## 2012-08-30 ENCOUNTER — Ambulatory Visit (INDEPENDENT_AMBULATORY_CARE_PROVIDER_SITE_OTHER): Payer: BC Managed Care – PPO | Admitting: Internal Medicine

## 2012-08-30 ENCOUNTER — Encounter: Payer: Self-pay | Admitting: Internal Medicine

## 2012-08-30 VITALS — BP 114/80 | HR 68 | Temp 98.1°F | Resp 16 | Wt 123.0 lb

## 2012-08-30 DIAGNOSIS — J45909 Unspecified asthma, uncomplicated: Secondary | ICD-10-CM

## 2012-08-30 DIAGNOSIS — E538 Deficiency of other specified B group vitamins: Secondary | ICD-10-CM

## 2012-08-30 DIAGNOSIS — F988 Other specified behavioral and emotional disorders with onset usually occurring in childhood and adolescence: Secondary | ICD-10-CM

## 2012-08-30 DIAGNOSIS — G479 Sleep disorder, unspecified: Secondary | ICD-10-CM

## 2012-08-30 MED ORDER — AMPHETAMINE-DEXTROAMPHETAMINE 10 MG PO TABS: ORAL_TABLET | ORAL | Status: DC

## 2012-08-30 NOTE — Assessment & Plan Note (Signed)
Continue with current  therapy as reflected on the Med list.  

## 2012-08-30 NOTE — Assessment & Plan Note (Signed)
Continue with current prescription therapy as reflected on the Med list.  

## 2012-08-30 NOTE — Assessment & Plan Note (Signed)
Continue with current prn prescription therapy as reflected on the Med list. Doing well w/o milk

## 2012-08-30 NOTE — Assessment & Plan Note (Signed)
Doing well 

## 2012-08-30 NOTE — Progress Notes (Signed)
Subjective:    HPI  F/u ADD F/u anxiety and stress - doing good F/u poor ability to focus, retain information, take tests. Much better now.  F/u sleep disorder (she used to be sleeping 18 h/d) -much better F/u difficulty falling asleep at night; deep sleep and inability to wake up in am F/u asthma, low B12 and low Vit D - doing good   BP Readings from Last 3 Encounters:  08/30/12 114/80  05/27/12 110/80  02/18/12 118/80   Wt Readings from Last 3 Encounters:  08/30/12 123 lb (55.792 kg)  05/27/12 116 lb (52.617 kg)  02/18/12 118 lb (53.524 kg)      Review of Systems  Constitutional: Negative for fever, chills, diaphoresis, activity change, appetite change, fatigue and unexpected weight change.  HENT: Negative for hearing loss, ear pain, nosebleeds, congestion, sore throat, sneezing, mouth sores, neck pain, dental problem, voice change, postnasal drip, sinus pressure and ear discharge.   Eyes: Negative for pain and visual disturbance.  Respiratory: Negative for cough, chest tightness, wheezing and stridor.   Cardiovascular: Negative for chest pain, palpitations and leg swelling.  Gastrointestinal: Negative for nausea, vomiting, abdominal pain, diarrhea, constipation, blood in stool, abdominal distention and rectal pain.  Genitourinary: Negative for dysuria, hematuria, decreased urine volume, vaginal bleeding, vaginal discharge, difficulty urinating, vaginal pain and menstrual problem.  Musculoskeletal: Negative for back pain, joint swelling and gait problem.  Skin: Negative for color change, pallor, rash and wound.  Neurological: Negative for dizziness, tremors, seizures, syncope, speech difficulty and light-headedness.  Hematological: Negative for adenopathy. Does not bruise/bleed easily.  Psychiatric/Behavioral: Negative for suicidal ideas, hallucinations, behavioral problems, confusion, sleep disturbance, self-injury, dysphoric mood, decreased concentration and agitation.  The patient is not nervous/anxious and is not hyperactive.        Objective:   Physical Exam  Constitutional: She appears well-developed and well-nourished. No distress.  HENT:  Head: Normocephalic.  Nose: Nose normal.  Mouth/Throat: Oropharynx is clear and moist.  Eyes: Conjunctivae are normal. Pupils are equal, round, and reactive to light. Right eye exhibits no discharge. Left eye exhibits no discharge. No scleral icterus.  Neck: Normal range of motion. Neck supple. No JVD present. No tracheal deviation present. No thyromegaly present.  Cardiovascular: Normal rate, regular rhythm and normal heart sounds.   No murmur heard. Pulmonary/Chest: No stridor. No respiratory distress. She has no wheezes.  Abdominal: Soft. Bowel sounds are normal. She exhibits no distension and no mass. There is no tenderness. There is no rebound and no guarding.  Musculoskeletal: She exhibits no edema and no tenderness.  Lymphadenopathy:    She has no cervical adenopathy.  Neurological: She displays normal reflexes. No cranial nerve deficit. She exhibits normal muscle tone. Coordination normal.  Skin: No rash noted. She is not diaphoretic. No erythema.  Psychiatric: She has a normal mood and affect. Her behavior is normal. Judgment and thought content normal.      Lab Results  Component Value Date   WBC 6.3 03/31/2011   HGB 13.1 03/31/2011   HCT 38.1 03/31/2011   PLT 258.0 03/31/2011   GLUCOSE 86 03/31/2011   CHOL 166 03/23/2008   TRIG 70 03/23/2008   HDL 52.8 03/23/2008   LDLCALC 99 03/23/2008   ALT 15 03/31/2011   AST 16 03/31/2011   NA 138 03/31/2011   K 4.6 03/31/2011   CL 104 03/31/2011   CREATININE 0.6 03/31/2011   BUN 15 03/31/2011   CO2 27 03/31/2011   TSH 0.50 03/31/2011  Assessment & Plan:

## 2012-09-12 DIAGNOSIS — N83209 Unspecified ovarian cyst, unspecified side: Secondary | ICD-10-CM

## 2012-09-12 HISTORY — DX: Unspecified ovarian cyst, unspecified side: N83.209

## 2012-11-29 ENCOUNTER — Encounter: Payer: Self-pay | Admitting: Internal Medicine

## 2012-11-29 ENCOUNTER — Ambulatory Visit (INDEPENDENT_AMBULATORY_CARE_PROVIDER_SITE_OTHER): Payer: BC Managed Care – PPO | Admitting: Internal Medicine

## 2012-11-29 VITALS — BP 116/84 | HR 80 | Temp 97.7°F | Resp 16 | Wt 122.0 lb

## 2012-11-29 DIAGNOSIS — J45909 Unspecified asthma, uncomplicated: Secondary | ICD-10-CM

## 2012-11-29 DIAGNOSIS — H6123 Impacted cerumen, bilateral: Secondary | ICD-10-CM

## 2012-11-29 DIAGNOSIS — F988 Other specified behavioral and emotional disorders with onset usually occurring in childhood and adolescence: Secondary | ICD-10-CM

## 2012-11-29 DIAGNOSIS — H6120 Impacted cerumen, unspecified ear: Secondary | ICD-10-CM

## 2012-11-29 MED ORDER — AMPHETAMINE-DEXTROAMPHETAMINE 10 MG PO TABS: ORAL_TABLET | ORAL | Status: DC

## 2012-11-29 MED ORDER — BUSPIRONE HCL 15 MG PO TABS: 15.0000 mg | ORAL_TABLET | Freq: Two times a day (BID) | ORAL | Status: AC

## 2012-11-29 MED ORDER — ALBUTEROL SULFATE HFA 108 (90 BASE) MCG/ACT IN AERS: 1.0000 | INHALATION_SPRAY | RESPIRATORY_TRACT | Status: AC | PRN

## 2012-11-29 NOTE — Progress Notes (Signed)
Subjective:    HPI  F/u ADD F/u anxiety and stress - doing good F/u poor ability to focus, retain information, take tests. Much better now.  F/u sleep disorder (she used to be sleeping 18 h/d) -much better F/u difficulty falling asleep at night; deep sleep and inability to wake up in am F/u asthma, low B12 and low Vit D  C/o cough in cold weather - seasonal   BP Readings from Last 3 Encounters:  11/29/12 116/84  08/30/12 114/80  05/27/12 110/80   Wt Readings from Last 3 Encounters:  11/29/12 122 lb (55.339 kg)  08/30/12 123 lb (55.792 kg)  05/27/12 116 lb (52.617 kg)      Review of Systems  Constitutional: Negative for fever, chills, diaphoresis, activity change, appetite change, fatigue and unexpected weight change.  HENT: Negative for congestion, dental problem, ear discharge, ear pain, hearing loss, mouth sores, nosebleeds, postnasal drip, sinus pressure, sneezing, sore throat and voice change.   Eyes: Negative for pain and visual disturbance.  Respiratory: Negative for cough, chest tightness, wheezing and stridor.   Cardiovascular: Negative for chest pain, palpitations and leg swelling.  Gastrointestinal: Negative for nausea, vomiting, abdominal pain, diarrhea, constipation, blood in stool, abdominal distention and rectal pain.  Genitourinary: Negative for dysuria, hematuria, decreased urine volume, vaginal bleeding, vaginal discharge, difficulty urinating, vaginal pain and menstrual problem.  Musculoskeletal: Negative for back pain, gait problem, joint swelling and neck pain.  Skin: Negative for color change, pallor, rash and wound.  Neurological: Negative for dizziness, tremors, seizures, syncope, speech difficulty and light-headedness.  Hematological: Negative for adenopathy. Does not bruise/bleed easily.  Psychiatric/Behavioral: Negative for suicidal ideas, hallucinations, behavioral problems, confusion, sleep disturbance, self-injury, dysphoric mood, decreased  concentration and agitation. The patient is not nervous/anxious and is not hyperactive.        Objective:   Physical Exam  Constitutional: She appears well-developed and well-nourished. No distress.  HENT:  Head: Normocephalic.  Nose: Nose normal.  Mouth/Throat: Oropharynx is clear and moist.  Eyes: Conjunctivae are normal. Pupils are equal, round, and reactive to light. Right eye exhibits no discharge. Left eye exhibits no discharge. No scleral icterus.  Neck: Normal range of motion. Neck supple. No JVD present. No tracheal deviation present. No thyromegaly present.  Cardiovascular: Normal rate, regular rhythm and normal heart sounds.   No murmur heard. Pulmonary/Chest: No stridor. No respiratory distress. She has no wheezes.  Abdominal: Soft. Bowel sounds are normal. She exhibits no distension and no mass. There is no tenderness. There is no rebound and no guarding.  Musculoskeletal: She exhibits no edema and no tenderness.  Lymphadenopathy:    She has no cervical adenopathy.  Neurological: She displays normal reflexes. No cranial nerve deficit. She exhibits normal muscle tone. Coordination normal.  Skin: No rash noted. She is not diaphoretic. No erythema.  Psychiatric: She has a normal mood and affect. Her behavior is normal. Judgment and thought content normal.  B wax    Lab Results  Component Value Date   WBC 6.3 03/31/2011   HGB 13.1 03/31/2011   HCT 38.1 03/31/2011   PLT 258.0 03/31/2011   GLUCOSE 86 03/31/2011   CHOL 166 03/23/2008   TRIG 70 03/23/2008   HDL 52.8 03/23/2008   LDLCALC 99 03/23/2008   ALT 15 03/31/2011   AST 16 03/31/2011   NA 138 03/31/2011   K 4.6 03/31/2011   CL 104 03/31/2011   CREATININE 0.6 03/31/2011   BUN 15 03/31/2011   CO2 27 03/31/2011  TSH 0.50 03/31/2011     Procedure Note :     Procedure :  Ear irrigation   Indication:  Cerumen impaction B   Risks, including pain, dizziness, eardrum perforation, bleeding, infection and others as well as  benefits were explained to the patient in detail. Verbal consent was obtained and the patient agreed to proceed.    We used "The Elephant Ear Irrigation Device" filled with lukewarm water for irrigation. A large amount wax was recovered. Procedure has also required manual wax removal with an ear loop.   Tolerated well. Complications: None.   Postprocedure instructions :  Call if problems.        Assessment & Plan:

## 2012-11-29 NOTE — Assessment & Plan Note (Signed)
Continue with current prescription therapy as reflected on the Med list.  

## 2012-11-29 NOTE — Progress Notes (Signed)
Pre visit review using our clinic review tool, if applicable. No additional management support is needed unless otherwise documented below in the visit note. 

## 2012-11-29 NOTE — Assessment & Plan Note (Signed)
Overall much better Cough in cold weather Zyrtec prn Rx prn

## 2012-11-29 NOTE — Assessment & Plan Note (Signed)
Will irrigate 

## 2013-03-07 ENCOUNTER — Ambulatory Visit: Payer: BC Managed Care – PPO | Admitting: Internal Medicine

## 2013-03-08 ENCOUNTER — Ambulatory Visit: Payer: BC Managed Care – PPO | Admitting: Internal Medicine

## 2013-03-29 ENCOUNTER — Ambulatory Visit: Payer: BC Managed Care – PPO | Admitting: Internal Medicine

## 2013-04-03 ENCOUNTER — Ambulatory Visit (INDEPENDENT_AMBULATORY_CARE_PROVIDER_SITE_OTHER): Payer: BC Managed Care – PPO | Admitting: Internal Medicine

## 2013-04-03 ENCOUNTER — Encounter: Payer: Self-pay | Admitting: Internal Medicine

## 2013-04-03 VITALS — BP 110/78 | HR 72 | Temp 97.7°F | Resp 16 | Wt 126.0 lb

## 2013-04-03 DIAGNOSIS — F988 Other specified behavioral and emotional disorders with onset usually occurring in childhood and adolescence: Secondary | ICD-10-CM

## 2013-04-03 MED ORDER — AMPHETAMINE-DEXTROAMPHETAMINE 10 MG PO TABS: ORAL_TABLET | ORAL | Status: AC

## 2013-04-03 MED ORDER — AMPHETAMINE-DEXTROAMPHETAMINE 10 MG PO TABS: ORAL_TABLET | ORAL | Status: DC

## 2013-04-03 NOTE — Progress Notes (Signed)
Pre visit review using our clinic review tool, if applicable. No additional management support is needed unless otherwise documented below in the visit note. 

## 2013-04-03 NOTE — Progress Notes (Signed)
Subjective:    HPI  F/u ADD F/u anxiety and stress - doing good F/u poor ability to focus, retain information, take tests. Much better now.  F/u sleep disorder (she used to be sleeping 18 h/d) -much better F/u difficulty falling asleep at night; deep sleep and inability to wake up in am F/u asthma - she had a flare up in [place] recently, low B12 and low Vit D  C/o cough in cold weather - seasonal   BP Readings from Last 3 Encounters:  04/03/13 110/78  11/29/12 116/84  08/30/12 114/80   Wt Readings from Last 3 Encounters:  04/03/13 126 lb (57.153 kg)  11/29/12 122 lb (55.339 kg)  08/30/12 123 lb (55.792 kg)      Review of Systems  Constitutional: Negative for fever, chills, diaphoresis, activity change, appetite change, fatigue and unexpected weight change.  HENT: Negative for congestion, dental problem, ear discharge, ear pain, hearing loss, mouth sores, nosebleeds, postnasal drip, sinus pressure, sneezing, sore throat and voice change.   Eyes: Negative for pain and visual disturbance.  Respiratory: Negative for cough, chest tightness, wheezing and stridor.   Cardiovascular: Negative for chest pain, palpitations and leg swelling.  Gastrointestinal: Negative for nausea, vomiting, abdominal pain, diarrhea, constipation, blood in stool, abdominal distention and rectal pain.  Genitourinary: Negative for dysuria, hematuria, decreased urine volume, vaginal bleeding, vaginal discharge, difficulty urinating, vaginal pain and menstrual problem.  Musculoskeletal: Negative for back pain, gait problem, joint swelling and neck pain.  Skin: Negative for color change, pallor, rash and wound.  Neurological: Negative for dizziness, tremors, seizures, syncope, speech difficulty and light-headedness.  Hematological: Negative for adenopathy. Does not bruise/bleed easily.  Psychiatric/Behavioral: Negative for suicidal ideas, hallucinations, behavioral problems, confusion, sleep disturbance,  self-injury, dysphoric mood, decreased concentration and agitation. The patient is not nervous/anxious and is not hyperactive.        Objective:   Physical Exam  Constitutional: She appears well-developed and well-nourished. No distress.  HENT:  Head: Normocephalic.  Nose: Nose normal.  Mouth/Throat: Oropharynx is clear and moist.  Eyes: Conjunctivae are normal. Pupils are equal, round, and reactive to light. Right eye exhibits no discharge. Left eye exhibits no discharge. No scleral icterus.  Neck: Normal range of motion. Neck supple. No JVD present. No tracheal deviation present. No thyromegaly present.  Cardiovascular: Normal rate, regular rhythm and normal heart sounds.   No murmur heard. Pulmonary/Chest: No stridor. No respiratory distress. She has no wheezes.  Abdominal: Soft. Bowel sounds are normal. She exhibits no distension and no mass. There is no tenderness. There is no rebound and no guarding.  Musculoskeletal: She exhibits no edema and no tenderness.  Lymphadenopathy:    She has no cervical adenopathy.  Neurological: She displays normal reflexes. No cranial nerve deficit. She exhibits normal muscle tone. Coordination normal.  Skin: No rash noted. She is not diaphoretic. No erythema.  Psychiatric: She has a normal mood and affect. Her behavior is normal. Judgment and thought content normal.      Lab Results  Component Value Date   WBC 6.3 03/31/2011   HGB 13.1 03/31/2011   HCT 38.1 03/31/2011   PLT 258.0 03/31/2011   GLUCOSE 86 03/31/2011   CHOL 166 03/23/2008   TRIG 70 03/23/2008   HDL 52.8 03/23/2008   LDLCALC 99 03/23/2008   ALT 15 03/31/2011   AST 16 03/31/2011   NA 138 03/31/2011   K 4.6 03/31/2011   CL 104 03/31/2011   CREATININE 0.6 03/31/2011   BUN  15 03/31/2011   CO2 27 03/31/2011   TSH 0.50 03/31/2011           Assessment & Plan:

## 2013-04-03 NOTE — Assessment & Plan Note (Signed)
Continue with current prescription therapy as reflected on the Med list.  

## 2013-07-04 ENCOUNTER — Ambulatory Visit: Payer: BC Managed Care – PPO | Admitting: Internal Medicine

## 2018-01-20 DIAGNOSIS — Z0000 Encounter for general adult medical examination without abnormal findings: Secondary | ICD-10-CM

## 2018-01-20 DIAGNOSIS — B009 Herpesviral infection, unspecified: Secondary | ICD-10-CM

## 2018-01-21 ENCOUNTER — Ambulatory Visit: Payer: PRIVATE HEALTH INSURANCE

## 2018-01-21 DIAGNOSIS — F329 Major depressive disorder, single episode, unspecified: Secondary | ICD-10-CM

## 2018-01-21 DIAGNOSIS — Z1329 Encounter for screening for other suspected endocrine disorder: Secondary | ICD-10-CM

## 2018-01-21 DIAGNOSIS — F419 Anxiety disorder, unspecified: Secondary | ICD-10-CM

## 2018-01-21 DIAGNOSIS — Z114 Encounter for screening for human immunodeficiency virus [HIV]: Secondary | ICD-10-CM

## 2018-01-21 DIAGNOSIS — Z130 Encounter for screening for diseases of the blood and blood-forming organs and certain disorders involving the immune mechanism: Secondary | ICD-10-CM

## 2018-01-21 DIAGNOSIS — H6123 Impacted cerumen, bilateral: Secondary | ICD-10-CM

## 2018-01-21 DIAGNOSIS — Z23 Encounter for immunization: Secondary | ICD-10-CM

## 2018-01-21 MED ORDER — VALACYCLOVIR HCL 500 MG PO TABS
500 mg | ORAL_TABLET | Freq: Two times a day (BID) | ORAL | 2 refills | Status: AC | PRN
Start: 2018-01-21 — End: ?

## 2018-01-21 NOTE — Patient Instructions
A note from your doctor:    Thank you for choosing the [facility] for your care!    Our plan, as discussed today:    1. Labs ordered at [facility]  2. Referral to therapy  3. Tetanus vaccine today  4. Ears cleaned out today  5. Refilled valacyclovir in case of cold sore    Your return appointment should be scheduled for 4-6 months to follow up mood.    Please call the office at [phone] if you need to return sooner for any reason.  ------------------------------------------------------------------------------------------------------------    Frequently asked questions:    1. How do I follow through on the plan from my office visit?    ? Written instructions/advice: Stop at our Check-Out desk in the clinic before walking out of the clinic. They will print out any written advice from your visit on an ???After Visit Summary.???    ? Laboratory tests: You may show up to any [place] laboratory and provide your name and date of birth and they will be able to find the orders for the lab tests:    o [facility] [phone] [address], [address])  ? Monday to Friday (8:00 am to 6:00 pm).  ? If this locations is not convenient, let me know and I can provide you with a list for other [facility] laboratories. [place] has weekend hours.  o On request, we can send to outside labs (e.g. [facility] or [facility])    ? Imaging studies: General X-Ray can be done same-day on the first floor of our building. Advanced imaging and diagnostic studies generally require insurance review and approval prior to scheduling. The Check-Out staff will provide information for scheduling.    ? Referrals: Stop at the Check-Out desk; the staff will verify that the referral has been placed. They will inform you if you can schedule your appointment immediately or if you need to wait for insurance authorization. They will also provide information for scheduling.    ? Follow-up: Stop at the Check-Out desk and the staff will schedule your follow-up visit. If you prefer to schedule at a later time, you can call our Call Center at [phone] or request an appointment online through [facility].  If you have seen someone other than your primary care provider for an urgent visit, it is okay to schedule your follow-up with either the doctor who saw you for urgent care or your primary care physician.     ? Outside records requests: If your doctor has indicated that outside records should be requested, please sign the form ???Authorization for Release of Health Information??? at the Check-Out before you leave. We cannot request your personal health records from other doctors/hospitals without your written permission.    2. How do I get my test results from the visit?    ? If you sign up for [facility] online ([email or web address]), we will release test results online with comments once your test results are available, usually within 1 week. Some specialized test results may take several weeks.  ? If you are not signed up for [facility] when your results become available, we will send your test results by letter within 1-2 weeks of your visit.    ? If an urgent test is ordered in your visit, such as an X-ray to look for a broken bone, our team will give you a call to make sure that you are aware of the results.  ? If another doctor orders a test  for you, it is best to speak first with that doctor directly about the result.  ? Please contact our office if you have not received a result in the expected time frame.  ? Please make sure your address and phone number are up-to-date in our system when you check in. We use these to contact you about important health information, including test results.    3. Can I ask questions after the visit?    Yes! It is important to let us know if you have any trouble with the treatment plan that we agree upon or if your symptom(s) are not improving as expected. For non-emergency contact, you may reach your doctor two ways:    ? Online: send non-urgent medical questions through the [facility] website ([email or web address]). These are usually returned within 1-2 business days.  ? Call the [facility] Call Center at [phone] during business hours to leave a message (or schedule a return appointment).  o Our call center staff are trained to gather information that will help your doctor???s office respond to your message quickly and correctly. Please give them as much information as possible to help them transmit your call to the appropriate staff in your doctor???s office.  o Calls will usually be returned on the same business day unless the doctor is out of the office, in which case they will be forwarded to the covering doctor within 1 business day.    4. How do I request a medication refill?    ? If you sign up for [facility] online, you can request refills under the ???Messaging??? section titled ???Request Rx Refill.???  ? You can ask your pharmacy to contact our office directly with a refill request.   ? You can call our Call Center yourself with a refill request.   ? Important guidelines for medication requests:   o New medications generally require an office visit for medical assessment and medication counseling.  o Medication safety monitoring is different for every medication. Please establish a plan with your doctor for the frequency of office visits, laboratory tests, urine screening, EKGs, blood pressure checks, and any other recommended monitoring for your medications. If the monitoring is not up-to-date, you will be asked to come in to the office (or laboratory, if needed) prior to the refill approval.  o Most long-term medications require a visit at least annually to check up on your health.  o Medications prescribed by a specialist may require refills through the office of the specialist. Please clarify this with your specialist and primary care doctor prior to requesting a refill.  o DEA scheduled medications often require an office visit to refill.  ? DEA Schedule II medications require a primary care visit for each refill request. Please plan ahead to schedule these visits with your personal primary care doctor. Common examples include: opiate medications such as morphine or oxycodone and stimulants such as amphetamine (Adderall).    ? Other DEA Schedule medications (Schedule III, IV, and V) also require regular primary care office visits for refills. Common examples include: tramadol (Ultram), carisoprodol (Soma), clonazepam (Klonopin), alprazolam (Xanax), lorazepam (Ativan), zolpidem (Ambien), pregabalin (Lyrica), testosterone.      5. How do I get after-hours care?    YNWGNFAO: We strive to offer same-day appointments in our office Monday-Friday, 8am-5pm. Call during these hours to request an urgent appointment: [phone].     Evenings/weekends: Our colleagues offer access to after-hours care at our walk-in clinic:    [facility]  Internal Medicine Evaluation [facility] Treatment Center  [address], [place]   [phone]  Monday - Friday: 10:00 am to 9:00 pm  Saturday - Sunday: 9:00 am to 5:00 pm  Laboratory and X-ray services available    The [facility] has additional urgent care sites, see locations at: [email or web address]    Paging: If you need to have your doctor (or the on-call doctor) paged through our emergency line, call [phone].    For serious and life-threatening concerns you should call 911.    [facility]: [place] [facility] offer fast-track service for minor injuries and illnesses. [facility] strives to treat patients who meet qualifying criteria within 90 minutes or less. Please note, however, that visits to the ER generally cost more than those to a doctor???s office or an urgent care center.    [facility]   Emergency Department  [address]  [place], [place] [address] Hospital Information: [phone]  Emergency Department: [phone]    [facility]   Emergency Department  [address]  [place], [place] [address]  Hospital Information: [phone]  Emergency Department: [phone]      Earwax, Home Treatment    Everyone produces earwax from the lining of the ear canal. It serves to lubricate and protect the ear. The wax that forms in the canal naturally moves toward the outside of the ear and falls out. Sometimes there will be a build-up of wax in the ear canal causing a blockage and loss of hearing. Directions are given below for home treatment.  Home Care:  If your doctor has advised you to remove a wax blockage yourself, follow these directions:  ??? Unless a prescription medicine was given, you may use an over-the-counter product made for clearing earwax (such as Debrox or Murine Earwax Drops). These contain carbamide peroxide and are available over-the-counter. Lie down with the blocked ear facing upward. Apply one dropper full of medicine and wait a few minutes. Wiggle the outer ear to get the solution to enter the canal.  ??? Lean over a sink or basin with the blocked ear facing downward. Use a rubber bulb syringe filled with warm (not hot or cold) water to rinse the ear several times. Use gentle pressure only.  ??? If you are having trouble draining the water out of your ear canal, put a few drops of rubbing alcohol (isopropyl alcohol) into the ear canal. This will help remove the remaining water.  ??? Repeat this procedure once a day for up to three days or until your hearing is back to normal. Do not use this treatment for more than three days in a row..  Do Not  ??? DO NOT use cold water to rinse the ear since this will make you dizzy.  ??? DO NOT perform this procedure if you have an ear infection.  ??? DO NOT perform this procedure if you have a ruptured eardrum.  ??? DO NOT use cotton applicators/Q-tips, matches, toothpicks, bobby pins, keys or other objects to ''clean'' the ear canal. This can cause infection of the ear canal or rupture of the eardrum. Because of their size and shape, it is common for cotton applicators/Q-tips to push the ear wax deeper into the ear canal instead of removing it. This can make matters worse.  Follow Up  with your doctor or this facility if you are not improving after three cleaning attempts.  Get Prompt Medical Attention  if any of the following occur:  ??? Worsening ear pain  ??? Fever of 100.4???F (38???C) or higher, or as directed by your healthcare provider  ??? Hearing does not return to normal after three days of treatment  ??? Fluid drainage or bleeding from the ear canal  ??? Swelling, redness or tenderness of the outer ear  ??? Headache, neck pain or stiff neck  ??? 2000-2015 [employer] [address], [place], [place] [address]. All rights reserved. This information is not intended as a substitute for professional medical care. Always follow your healthcare professional's instructions.
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
## 2018-01-27 ENCOUNTER — Ambulatory Visit: Payer: PRIVATE HEALTH INSURANCE

## 2018-01-28 DIAGNOSIS — B009 Herpesviral infection, unspecified: Secondary | ICD-10-CM

## 2018-01-28 MED ORDER — VALACYCLOVIR HCL 500 MG PO TABS
500 mg | ORAL_TABLET | Freq: Two times a day (BID) | ORAL | 0 refills | Status: AC
Start: 2018-01-28 — End: ?

## 2018-01-28 NOTE — Addendum Note
Addended by: Red Christians on: 01/28/2018 09:17 AM     Modules accepted: Orders

## 2018-02-23 MED ORDER — VALACYCLOVIR HCL 500 MG PO TABS
500 mg | ORAL_TABLET | Freq: Two times a day (BID) | ORAL | 0 refills
Start: 2018-02-23 — End: ?

## 2018-03-12 ENCOUNTER — Ambulatory Visit: Payer: PRIVATE HEALTH INSURANCE

## 2018-03-12 DIAGNOSIS — H02843 Edema of right eye, unspecified eyelid: Secondary | ICD-10-CM

## 2018-03-12 MED ORDER — ERYTHROMYCIN 5 MG/GM OP OINT
0.5 [in_us] | Freq: Three times a day (TID) | OPHTHALMIC | 0 refills | Status: AC
Start: 2018-03-12 — End: ?

## 2018-03-12 NOTE — Patient Instructions
Sty (or Stye)  A sty is an infection of the oil gland of the eyelid. It may develop into a small pocket of pus (an abscess). This can causepain, redness, and swelling. In early stages, a sty is treated with antibiotic cream, eye drops, or a small towel soaked in warmwater (a warm compress). More severe cases may need to be opened and drained by a healthcare provider.  Home care   Eye drops or ointment are usually prescribed to treat the infection. Use these as directed.   Artificial tears may also be used to lubricate the eye and make it more comfortable. You can buy these over the counter without a prescription. Talk with your healthcare provider before using any over-the-counter treatment for a sty.   Apply a warm, damptowel to the affectedeye for at least 5 minutes, 3 to 4 times a day for a week. Warm compresses open the pores and speed the healing. But if the compresses are too hot, they may burn your eyelid.   Sometimes the sty will drain with this treatment alone. If this happens, keep using the antibiotic until all the redness and swelling are gone.   Wash your handsbefore and aftertouching the infected eyelid to avoid spreading the infection.   Don't squeeze or try to break open the sty.  Follow-up care  Follow up with your healthcare provider, or as advised.  When to seek medical advice  Call your healthcare provider right away if any of these occur:   Increase in swelling or redness around the eyelid after 48 to 72 hours   Increase in eye pain or the eyelid blisters   Increase in warmth--the eyelid feels hot   Drainage of blood or thick pus from the sty   Blister on the eyelid   Inability to open the eyelid due to swelling   Fever of 100.4F (38C) or above, or as directed by your provider   Vision changes   Headache or stiff neck   The sty comes back  Date Last Reviewed: 08/13/2015   2000-2018 [employer] [address], [place] [address]. All rights  reserved. This information is not intended as a substitute for professional medical care. Always follow your healthcare professional's instructions.

## 2018-03-12 NOTE — Progress Notes
[facility]/Walk-in Note    PATIENT:  Jill Ellis  MRN:  [id number]  DOB:  09/06/1990  DATE OF SERVICE:  03/13/2018    PRIMARY CARE PROVIDER: Danielle Dess., MD    Chief Complaint   Patient presents with   ??? Belepharitis     left eyelid swelling x 1 day      Subjective:     Jill Ellis is a 28 y.o. female states that she woke up today with right upper eyelid swelling.  No discharge. No blurry vision.  + itching.  She used a new makeup last night. Used cold compresses this morning.  No claritin or antihistamine use     Chronic Problems: There is no problem list on file for this patient.      Current Medications:   Current Outpatient Medications   Medication Sig   ??? erythromycin 5 mg/g ophthalmic ointment Place 0.5 inches into both eyes three (3) times daily for 7 days.     No current facility-administered medications for this visit.         Allergies: Patient has no known allergies.    Sochx:    Social History     Socioeconomic History   ??? Marital status: Unknown     Spouse name: Not on file   ??? Number of children: Not on file   ??? Years of education: Not on file   ??? Highest education level: Not on file   Occupational History   ??? Not on file   Social Needs   ??? Financial resource strain: Not on file   ??? Food insecurity:     Worry: Not on file     Inability: Not on file   ??? Transportation needs:     Medical: Not on file     Non-medical: Not on file   Tobacco Use   ??? Smoking status: Never Smoker   ??? Smokeless tobacco: Never Used   ??? Tobacco comment: tried once or twice, hated it   Substance and Sexual Activity   ??? Alcohol use: Yes     Alcohol/week: 9.6 oz     Types: 14 Glasses of Wine (5 oz), 2 Shots of Liquor (1.5 oz) per week     Comment: binge episodes on weekends   ??? Drug use: Yes     Types: Marijuana     Comment: recreational thc/cbd to sleep   ??? Sexual activity: Yes     Partners: Male     Birth control/protection: IUD (hormonal)   Lifestyle   ??? Physical activity:     Days per week: Not on file Minutes per session: Not on file   ??? Stress: Not on file   Relationships   ??? Social connections:     Talks on phone: Not on file     Gets together: Not on file     Attends religious service: Not on file     Active member of club or organization: Not on file     Attends meetings of clubs or organizations: Not on file     Relationship status: Not on file   Other Topics Concern   ??? Do you exercise at least a day, 3 or more days a week? Yes   ??? Types of Exercise? (List in Comments) Yes     Comment: hot yoga   ??? Do you follow a special diet? Yes     Comment: avoids dairy, gluten   ??? Vegan? No   ???  Vegetarian? No   ??? Pescatarian? No   ??? Lactose Free? No   ??? Gluten Free? No   ??? Omnivore? No   Social History Narrative   ??? Not on file       Review of Systems: Pertinent positives and negatives included in HPI.    Objective:     BP 101/65  ~ Pulse 64  ~ Temp 36.7 ???C (98.1 ???F) (Tympanic)  ~ Resp 16  ~ LMP 03/12/2018  ~ SpO2 99%  There is no height or weight on file to calculate BMI.    NAD  Eyes:  PERRLA, EOMI, no periorbital erythema, no periorbital swelling, + right upper eyelid swelling, no styes seen, no conjunctival injection, no conjunctival exudate  Rest of HEENT exam wnl  Oral: WNL  Neuro: no focal deficits   CV: RRR  Lungs: CTAB        In addition to the above information???  Labs Reviewed:   Imaging Reviewed:  EKG interpreted:    Other tests reviewed:  Discussed with family or other physicians:    Assessment:    1. Eyelid gland swelling, right         Plan/MDM:      Orders Placed This Encounter   ??? erythromycin 5 mg/g ophthalmic ointment     Take claritin or benadryl  Cold compresses   ???  Follow up: RTC if SWOP or if questions/concerns arise, otherwise f/u PCP 3-5 days  Pt clinically stable and in no acute distress at this time.     The above plan of care, diagnosis, orders, and follow-up were discussed with the patient.??? The patient had all questions answered satisfactorily and understands this recommended plan of care.  See AVS/patient instructions for additional information and counseling materials provided to the patient.    Author:  Lyman Bishop, DO

## 2018-03-14 ENCOUNTER — Telehealth: Payer: BLUE CROSS/BLUE SHIELD

## 2018-03-14 ENCOUNTER — Ambulatory Visit: Payer: PRIVATE HEALTH INSURANCE

## 2018-03-14 DIAGNOSIS — H00011 Hordeolum externum right upper eyelid: Secondary | ICD-10-CM

## 2018-03-14 MED ORDER — FLUOROMETHOLONE ACETATE 0.1 % OP SUSP
1 [drp] | Freq: Three times a day (TID) | OPHTHALMIC | 0 refills | Status: AC
Start: 2018-03-14 — End: 2018-03-15

## 2018-03-14 NOTE — Telephone Encounter
Forwarded by: Adilyn Humes W Barbar Brede

## 2018-03-14 NOTE — Patient Instructions
A STY/CHALZION/HORDEOLUM is an inflammation of the oil gland of the eyelid. It may develop into a small bump causing pain, redness and swelling.   Best and more efficient treatment is done with hot packs (small towel or cotton balls soaked in hot water).Try to do it at least 5 times a day for weeks.  Rarely they have to be excised with surgery or injected with medication.    Home Care:   Apply a hot wet towel or cotton ball to the lid - with massage for 2-3 minutes, 5-6 times a day. Heat will increase blood flow and speed the healing.   Make sure to do the massage on the lid after - that helps your body to clean/absorve the bump.  Sometimes the bump will drain with this treatment alone. If this happens, continue the heat and until all the redness and swelling are gone.   Wash your hands before and after touching the affected eye to avoid infection.  Do not squeeze or try to puncture the sty.               Eye drops or ointment. Use these as directed.  1 - Artificial Tears:  Over-the-counter drops that can soothe the surface of the eye. Look for products that say ''tears'' or ''lubricant eye drop''. In general avoid ''redness relievers''.  Consider using NON-preservative or preservative-free drops principally if you use them more than 4 times a day.  You can use artificial tears as often as necessary -- once or twice a day or as often as several time an hour. Recommended 3-4 times a day.  There are many brands on the market, so you may want to try several to find the one you like best.  Some of those brands are Refresh, Systane, GenTeal...    2 - Topical prescribed ointment:    After the warm compresses- normally for about one week.  You can with the excess of ointment outside the lid do a massage on your lids.  Remember it will blur your vision for a good 1/2 hour.  ??? To help tune up the eyelids you will be using  Erythromycin or Maxitrol (neomycin, polymyxin B, dexamethasone) This should be applied at night or twice a day, depending on your prescription, in the lower lid  by putting a little amount inside the eye and then rubbing it (do a massage) on the eyelids and eyelashes for one week.   ??? - also FML-fluoromethalone - 3 times a day for one week for inflammation     3 - Over-the-counter anti inflammatories such as ibuprofen (Advil or Motrin) can be used if you have no contraindication.        Return if :  Increased swelling or redness around the other eyelid on the same eye.  Inability to open the eye due to swelling   Fever of 100.4???F (38???C) or higher, or as directed by your healthcare provider   Vision changes- double vision   In very rare cases they can become infected and cause a cellulitis - an infection of the skin          To avoid stye/chalzion is recommended to do lid hygiene as a regular basis - recommended to do it once or 2 times a day.    Lid hygiene has 2 parts: warm compresses and scrubbing:    1. Warm Compresses:    A wash cloth soaked in warm-to-hot tap water help over the eyes for 3-4 minutes  once or twice a day. This helps to improve the quality of the tears by melting the oils produced in the eye lids and moving them into part of the tears.    You can also if you prefer try ''hot potato'': Microwave a small to medium potato for 30-45 seconds. Wrap it in a moist hand towel. Place this over the closed eye for 5 minutes twice a day.      2. Eyelid Cleaning or Scrubs:    This can be done by using a warm cup of water. Dunk a cotton ball, gauze pad or Q-tip in the solution and scrub the outer eyelid margin. Do it after the warm compress.  Commercially produced ''Ocusoft'' lid scrubs may also be used instead.    Also always recommended to lubricate with eyes with artificial tears

## 2018-03-14 NOTE — Telephone Encounter
Hello,     Pharmacy is following up, thank you.

## 2018-03-14 NOTE — Progress Notes
Jill Ellis is a 28 y.o. female who comes to Urgent Care on 03/14/2018  complaining right upper lid with itching, bump, redness, swelling x 03/11/18.  No change in vision, no drainage.  Had a cold 2 weeks ago, no recent travels - dad with cold sores x last week.    Seen at [facility], given EES ung three times a day x 03/12/18, applies warm compress three times a day.  Started Valtrex 500 mg daily x 03-12-18.  Takes Sulfamethoxazole twice a day x 03/13/18.      History of styes and cold sores on her lip.    Base Eye Exam     Visual Acuity (Snellen - Linear)       Right Left    Dist sc 20/20 20/20          Tonometry (Applanation, 10:01 AM)       Right Left    Pressure 16 16          Pupils       Dark Light React APD    Right 4.5 3.5 Brisk None    Left 5 4 Brisk None          Visual Fields       Left Right     Full Full          Extraocular Movement       Right Left     Full Full          Neuro/Psych     Oriented x3:  Yes    Mood/Affect:  Normal            Slit Lamp and Fundus Exam     Slit Lamp Exam       Right Left    Lids/Lashes large chalazion Normal    Conjunctiva/Sclera White and quiet White and quiet    Cornea Clear Clear    Anterior Chamber Deep and quiet Deep and quiet    Iris Normal Normal    Lens Clear Clear          Fundus Exam       Right Left    Disc Normal Normal                Assessment and Plan     Problem List        Eye / Vision Problems    1. Stye     Overview      Recommended warm compresses, massage and Erythromycin and   FML-fluoromethalone - will see oculoplastic  - patient would like to have   it remove it          Stye              Orders:  Orders Placed This Encounter   ??? fluorometholone 0.1% ophthalmic suspension     Sig: Place 1 drop into the right eye three (3) times daily for 7 days.     Dispense:  5 mL     Refill:  0       Follow ups:  Return in about 3 weeks (around 04/04/2018) for oculoplastic.         I discussed the above assessment and plan with the patient. She had the opportunity to ask questions, and her questions and concerns were addressed. She was reminded to call if there is any significant change or worsening in vision, or to get an evaluation, urgently if appropriate.    Author:   Jossie Ng.  Terrionna Bridwell, MD  This note details the assessment and plan of the encounter for this date. For a complete note and record of the encounter, please see the Encounter Summary.

## 2018-03-14 NOTE — Telephone Encounter
[facility] calling for an alternative rx for the flarex, the pharm states its not avail. Please call [facility].      thanks

## 2018-03-15 MED ORDER — PREDNISOLONE ACETATE 1 % OP SUSP
1 [drp] | Freq: Two times a day (BID) | OPHTHALMIC | 0 refills | Status: AC
Start: 2018-03-15 — End: ?

## 2018-03-15 NOTE — Telephone Encounter
Forwarded by: Kasen Adduci W Madison Albea

## 2018-03-15 NOTE — Telephone Encounter
Changed to PF - prednisolone acetate 2 times a day for one week

## 2018-03-15 NOTE — Addendum Note
Addended by: Priscille Heidelberg on: 03/14/2018 04:34 PM     Modules accepted: Orders

## 2018-03-17 ENCOUNTER — Ambulatory Visit: Payer: PRIVATE HEALTH INSURANCE

## 2018-03-17 DIAGNOSIS — H00011 Hordeolum externum right upper eyelid: Secondary | ICD-10-CM

## 2018-03-17 NOTE — Progress Notes
Assessment and Plan     Problem List        Eye / Vision Problems    1. Stye     Overview      03/17/18 returns to urgent care because she is worried since the stye   ruptured and drained. Reassured her that it will continue to drain over   next few days and then heal up. Continue warm compresses and massage. No   need for additional oral antibiotics.     Recommended warm compresses, massage and Erythromycin and   FML-fluoromethalone - will see oculoplastic  - patient would like to have   it remove it                      Orders:  Warm compresses, lid massage  Antibiotic ointment    Follow ups:  Return if symptoms worsen or fail to improve.         I discussed the above assessment and plan with the patient. She had the opportunity to ask questions, and her questions and concerns were addressed. She was reminded to call if there is any significant change or worsening in vision, or to get an evaluation, urgently if appropriate.    Author:   Windy Fast. Feit-Leichman, MD  This note details the assessment and plan of the encounter for this date. For a complete note and record of the encounter, please see the Encounter Summary.

## 2018-03-17 NOTE — Patient Instructions
Blepharitis is a common condition that occurs when the oil glands in the eyelids do not work as well as they should.  The oil glands are located at the eyelid margin and their job is to put out oil to coat the tears and keep them from evaporating too quickly or being too runny.  The best way to help the oil glands is to apply heat to help melt the oil so it can come out more smoothly.  Please use a a good source of heat for 20 minutes on the eyelids morning and night.  This can be a heating pad, an eye pad that can be heated in the microwave, dry rice heated up in a clean sock in the microwave, or a hot water bottle.      Cleaning Eyelids/lashes It is also a good idea to gently press on the eyelids after heating them up to encourage expression of the oil glands.  If there is crusting on the eyelashes it is important to clean them morning an night with a washcloth, wet Q-tip or cotton ball, or Ocusoft lid scrubs.  Taking care of your eyelids will decrease the redness and your eyes and prevent stye and chalazions (lumps in the eyelids which are stopped up oil glands) from forming.     Ophthalmic Ointments for Eyelids To help tune up the eyelids you will be using erythromycin ophthalmic ointment This should be used at night (it will make your vision blurry) by putting a little inside the eye and then rubbing it on the eyelids and eyelashes for one week.

## 2018-03-31 ENCOUNTER — Ambulatory Visit: Payer: PRIVATE HEALTH INSURANCE | Attending: Ophthalmic Plastic and Reconstructive Surgery

## 2018-03-31 DIAGNOSIS — H00011 Hordeolum externum right upper eyelid: Secondary | ICD-10-CM

## 2018-03-31 NOTE — Progress Notes
Jill Ellis is a 28 y.o. female    Assessment and Plan     Problem List        Eye / Vision Problems    1. Stye     Overview      03/17/18 returns to urgent care because she is worried since the stye   ruptured and drained. Reassured her that it will continue to drain over   next few days and then heal up. Continue warm compresses and massage. No   need for additional oral antibiotics.     Recommended warm compresses, massage and Erythromycin and   FML-fluoromethalone - will see oculoplastic  - patient would like to have   it remove it                 Slit Lamp and Fundus Exam     Slit Lamp Exam       Right Left    Lids/Lashes large chalazion Normal    Conjunctiva/Sclera White and quiet White and quiet    Cornea Clear Clear    Anterior Chamber Deep and quiet Deep and quiet    Iris Normal Normal    Lens Clear Clear          Fundus Exam       Right Left    Disc Normal Normal            Impression:    Jill Ellis was seen in the eye clinic today. She has a stye on the right upper eyelid for the past few weeks. I have recommended warm compress in this instance.      Orders:  No orders of the defined types were placed in this encounter.      Follow ups:  No follow-ups on file.         I discussed the above assessment and plan with the patient. She had the opportunity to ask questions, and her questions and concerns were addressed. She was reminded to call if there is any significant change or worsening in vision, or to get an evaluation, urgently if appropriate.    Author:   Donell Sievert, MD  This note details the assessment and plan of the encounter for this date. For a complete note and record of the encounter, please see the Encounter Summary.

## 2018-04-28 ENCOUNTER — Ambulatory Visit: Payer: PRIVATE HEALTH INSURANCE | Attending: Ophthalmic Plastic and Reconstructive Surgery

## 2018-05-25 ENCOUNTER — Ambulatory Visit: Payer: PRIVATE HEALTH INSURANCE

## 2018-05-26 ENCOUNTER — Telehealth: Payer: PRIVATE HEALTH INSURANCE

## 2018-05-26 DIAGNOSIS — F419 Anxiety disorder, unspecified: Secondary | ICD-10-CM

## 2018-05-26 DIAGNOSIS — I7300 Raynaud's syndrome without gangrene: Secondary | ICD-10-CM

## 2018-05-26 DIAGNOSIS — F329 Major depressive disorder, single episode, unspecified: Secondary | ICD-10-CM

## 2018-05-26 DIAGNOSIS — Z8619 Personal history of other infectious and parasitic diseases: Secondary | ICD-10-CM

## 2018-05-26 DIAGNOSIS — R079 Chest pain, unspecified: Secondary | ICD-10-CM

## 2018-05-26 MED ORDER — VALTREX 1 G PO TABS
0 refills
Start: 2018-05-26 — End: ?

## 2018-05-26 MED ORDER — VALACYCLOVIR HCL 500 MG PO TABS
500 mg | ORAL_TABLET | ORAL | 2 refills | 10.00000 days | Status: AC | PRN
Start: 2018-05-26 — End: ?

## 2018-05-26 MED ORDER — ALBUTEROL SULFATE HFA 108 (90 BASE) MCG/ACT IN AERS
2 | Freq: Four times a day (QID) | RESPIRATORY_TRACT | 1 refills | Status: AC | PRN
Start: 2018-05-26 — End: ?

## 2018-05-26 NOTE — Telephone Encounter
Called patient and left a voicemail message to go over her medication list and to gather pertinent information for her telephone visit with Dr. Aundria Rud scheduled today at 11:45am.

## 2018-05-26 NOTE — Progress Notes
Internal Medicine Outpatient Telemedicine Progress Note    Patient: Jill Ellis  MRN: [id number]  Primary Care Physician: Danielle Dess., MD  Date of Service: 05/26/2018  Location of provider: [phone] office  Location of patient: home  See below for Telehealth consent    History:      Chief Complaint   Patient presents with   ??? Follow-up     anxiety, lime disease   ??? Chest Pain     x 1 month      History of Present Illness/Interval History:  Jill Ellis is a 27 y.o. female with a history of depression and anxiety who presents for follow up care:    She feels like her anxiety is doing better at this time. She is seeing a therapist and psychiatrist which she thinks is helping. She is not currently taking any medications. She notes her mom is doing better which helps relieve some of her anxiety.    She has been having sharp pain in the left side of her chest off and on for the last 6-8 weeks. It is worse if she takes a deep breath. No clear pattern to when she gets the pain, can occur at various times of day and is separate from food or exercise. No pain with exercise. The episodes will last 1-15 minutes and goes away on its own. She saw an on call doctor through her insurance, they told her it might be due to anxiety; another doctor suggested possibly asthma. She has an IUD, no personal or family history of blood clot. About two months ago she was having frequent cough for several weeks.    She is nervous about a piece of her breast implant which she told was detached. She saw multiple plastic surgeons and they did not detect a piece and if there was a piece off it would not cause a problem    At one point she was told that she had Lyme disease, she is not sure how this was determined possibly based on a blood test. She grew up in [place], went hiking but does not recall if she ever had tick bites. Her sister has also been told she had Lyme disease. Starting in 7th grade she felt a lot of fatigue. She does not remember ever being diagnosed with Lyme while in [place] or taking antibiotics for Lyme.    She will get tingling sensation in her feet and hands, occurs at random. Her fingers sometimes turn white, not exclusively when exposed to cold. Her mom has Raynaud's disease. The episodes improve if she moves and exercises her hands.      Chronic conditions:  [x] Problem list reviewed and updated.  Patient Active Problem List    Diagnosis Date Noted   ??? Stye 03/14/2018     03/17/18 returns to urgent care because she is worried since the stye ruptured and drained. Reassured her that it will continue to drain over next few days and then heal up. Continue warm compresses and massage. No need for additional oral antibiotics.     Recommended warm compresses, massage and Erythromycin and FML-fluoromethalone - will see oculoplastic  - patient would like to have it remove it         Reconciled medication list:  Medications that the patient states to be currently taking   Medication Sig   ??? albuterol 90 mcg/act inhaler Inhale 2 puffs every six (6) hours as needed.   ??? ALPHA LIPOIC ACID PO Take  by mouth.   ??? Ferrous Sulfate (IRON PO) Take by mouth.   ??? MELATONIN PO Take by mouth.   ??? multivitamin tablet Take by mouth daily.   ??? Omega-3 Fatty Acids (FISH OIL PO) Take by mouth.   ??? Probiotic Product (PROBIOTIC PO) Take by mouth.   ??? UNABLE TO FIND Med Name: Adaptogen .   ??? valACYclovir 500 mg tablet Take 500 mg by mouth as needed for.       Allergies:  No Known Allergies    Additional PMH/FH/SH:  Tobacco:  reports that she has never smoked. She has never used smokeless tobacco. She reports current alcohol use of about 9.6 oz of alcohol per week. She reports current drug use. Drug: Marijuana.  family history includes Alcohol abuse in her mother; Arthritis in her mother; Asthma in her brother; Breast cancer in her maternal grandmother; Depression in her brother, father, mother, and sister; Heart attack in her paternal grandfather; Mental illness in her mother; Stroke in her paternal grandmother.    ROS:   Review of Systems   Constitutional: Positive for malaise/fatigue. Negative for chills and fever.   Respiratory: Negative for shortness of breath.    Cardiovascular: Positive for chest pain. Negative for leg swelling.   Psychiatric/Behavioral: Positive for depression. Negative for suicidal ideas. The patient is nervous/anxious.         Health Maintenance:    Health Maintenance   Topic Date Due   ??? Influenza Vaccine (Season Ended) 09/13/2018   ??? Cervical Ca Screening: PAP Smear  11/13/2019   ??? Tdap/Td Vaccine (2 - Td) 01/22/2028   ??? HIV Screening  Completed   ??? HPV Vaccines  Aged Out       Immunization History   Administered Date(s) Administered   ??? Tdap 01/21/2018       2018 ACC/AHA guidelines recommends that patient is not in statin benefit group. Encourage adherence to heart-healthy lifestyle.    10-year ASCVD risk  cannot be calculated because at least one required variable is not available in [facility]  as of 11:57 AM on 05/26/2018  10-year ASCVD risk with optimal risk factors cannot be calculated.  Values used to calculate ASCVD score:  Age: 28 y.o. Cannot calculate risk because age is not between 48 and 71 years old.  Gender: Female Race: Not African American.  Cannot calculate risk because HDL cholesterol not documented within the past 5 years.    Cannot calculate risk because Total cholesterol not documented within the past 5 years.    LDL cholesterol not documented within the past 5 years.    Cannot calculate risk because BP was not documented within the last 2 years. BP was measured today.  The patient is not being treated with a medication that influences SBP.  The patient is currently not a smoker.  The patient does not have a diagnosis of diabetes.    Click here for the [facility] ASCVD Cardiovascular Risk Estimator Plus tool Office manager).    Additional Health Maintenance Items:  None    Physical Exam:      Vitals:    05/26/18 1112   Weight: 117 lb (53.1 kg)   Height: 5' 7'' (1.702 m)       System Check if Normal Positive or additional negative findings   Constit  [x]  General appearance     Eyes  []  Conj/Lids []  Pupils  []  Fundi     ENMT  []  External ears/nose   []  Otoscopy []  Hearing   []   Nasal mucosa []  Oropharynx   []  Lips/teeth/gums     Neck  []  Inspection/palpation   []  Thyroid     Resp  [x]  Effort []  Percussion   []  Auscultation     CV  []  Auscultation []  Lower extremities    []  Abd aorta []  Femoral   []  Pedal     Breast  []  Inspection []  Palpation     GI  []  Normoactive bowel sounds    []  Abd (masses or tenderness)   []  Liver/spleen []  Rectal     GU  M: []  Scrotum []  Penis []  Prostate   F:  []  External []  Bladder   []  Cervix  []  Uterus []  Adnexa      Lymph  []  Neck []  Axillae []  Groin     MS  []  Gait []  Digits/nails  Specify site examined:    []  Inspect/palp []  ROM   []  Stability []  Strength/tone         Skin  []  Inspection []  Palpation     Neuro  []  CN2-12 []  DTRs     []  Sensation     Psych  []  Insight/judgement   [x]  Orientation [x]  Memory   []  Mood/affect       PHQ-9 Results  Depression Screening (Patient Health Questionnaire PHQ) 01/21/2018 05/26/2018   PHQ-2: Feeling down, depressed, or hopeless No -   PHQ-2: Little interest or pleassure in doing things No -   Little interest or pleasure in doing things Several days Not at all   Feeling down, depressed, or hopeless Several days Not at all   Trouble falling or staying asleep, or sleeping too much Nearly every day Nearly every day   Feeling tired or having little energy Nearly every day Nearly every day   Poor appetite or overeating Several days Not at all   Feeling bad about yourself - or that you are a failure or have let yourself or your family down More than half the days Not at all   Trouble concentrating on things, such as reading the newspaper or watching television More than half the days More than half the days   Moving or speaking so slowly Or being so fidgety or restless Not at all Not at all   Thoughts that you would be better off dead, or of hurting yourself in some way Not at all Not at all   Total Score 13 8       GAD-7 Results  GAD-7 01/21/2018 05/26/2018   Feeling nervous, anxious, or on edge 3-Nearly every day 3-Nearly every day   Not being able to stop or control worrying 3-Nearly every day 3-Nearly every day   Worrying too much about different things 3-Nearly every day 3-Nearly every day   Trouble relaxing 2-More than half the days 3-Nearly every day   Being so restless that is hard to sit still 0-Not at all 0-Not at all   Becoming easily annoyed or irritable 2-More than half the days 1-Several days   Feeling afraid, as if something awful might happen 3-Nearly every day 1-Several days   Total Score 16 14         Data:   I have     [] reviewed/ordered radiology, [x] reviewed/ordered labs, [] reviewed/ordered diag med test   []  independent interpretation of study, [] decided to get outside medical records    [] reviewed & summarized old records or obtain from someone other than patient [] obtained history from someone other than patient [] review test with  performing physician   Lab Review:   No results found for: TSH   Lab Results   Component Value Date    WBC 4.6 01/25/2018    HGB 14.4 01/25/2018    HCT 41.3 01/25/2018    MCV 94.5 01/25/2018    PLT 284 01/25/2018     Lab Results   Component Value Date    CREAT 0.66 01/25/2018    BUN 10 01/25/2018    NA 139 01/25/2018    K 4.1 01/25/2018     No results found for: CHOL, CHOLHDL, CHOLDLQ, CHOLDLCAL, TRIGLY       Studies:  No recent imaging/studies    Assessment/Plan:      Jill Ellis is a 27 y.o. female with a history of depression and anxiety who presents for management of the following medical conditions:    Anxiety: Reports symptoms improved the GAD7 remains elevated. Suspect generalized anxiety disorder though also consider adjustment disorder. She is now seeing an outside therapist and psychiatrist.  Depression, unspecified depression type: Suspect major depressive disorder given chronicity of symptoms. Also considered adjustment disorder related to stress associated with her mother's illness. She is now seeing an outside therapist and psychiatrist.  - Continue to monitor symptoms  - She declines medication at this time    History of Lyme disease: No report of acute Lyme disease and does not recall treatment with antibiotics. She did grow up in [place] where there are some reported cases of Lyme though not as prevalent as in the [place]. Unclear if diagnosis was made based on symptoms or serologic studies.  -     Lyme Disease EIA/WB; Future  - Discussed post-Lyme syndrome does not respond to additional antibiotic therapy    Raynaud's phenomenon without gangrene: Unclear if finger color changes are true Raynaud's as does not appear to occur in response to exposure to cold temperature. Symptoms possibly related to paresthesia of unclear etiology.  -     Vitamin B12; Future  -     Antinuclear Ab; Future  -     RPR; Future  -     Hgb A1c; Future    Chest pain, unspecified type: Suspect musculoskeletal given intermittent, worse with deep breath, and symptoms began after prolonged episode of coughing. Lower concern for PE, as she is a nonsmoker and not on estrogen-containing birth control. Low concern for ACS given young age and no symptoms with exertion.    Other orders  -     albuterol 90 mcg/act inhaler; Inhale 2 puffs every six (6) hours as needed.  -     valACYclovir 500 mg tablet; Take 1 tablet (500 mg total) by mouth as needed for.       Follow-up: Return in about 4 months (around 09/26/2018), or if symptoms worsen or fail to improve, for fatigue, finger color changes.    The above plan of care, diagnosis, orders, and follow-up were discussed with the patient.  Questions related to this recommended plan of care were answered.  See AVS for additional information and counseling materials provided to the patient.    Time of note filed does not necessarily reflect the time of encounter.    Signed, Inez Stantz C. Aundria Rud, MD 05/26/2018 9:06 PM    Patient Consent to Telehealth Questionnaire   [facility] TELEHEALTH PRECHECKIN QUESTIONS 05/26/2018   By clicking ''I Agree'', I consent to the below:  I Agree     - I agree to be treated via a video visit and  acknowledge that I may be liable for any relevant copays or coinsurance depending on my insurance plan.  - I understand that this video visit is offered for my convenience and I am able to cancel and reschedule for an in-person appointment if I desire.  - I also acknowledge that sensitive medical information may be discussed during this video visit appointment and that it is my responsibility to locate myself in a location that ensures privacy to my own level of comfort.  - I also acknowledge that I should not be participating in a video visit in a way that could cause danger to myself or to those around me (such as driving or walking).  If my provider is concerned about my safety, I understand that they have the right to terminate the visit.

## 2018-05-27 ENCOUNTER — Telehealth: Payer: BLUE CROSS/BLUE SHIELD

## 2018-05-27 MED ORDER — VALTREX 1 G PO TABS
ORAL | 0 refills | 10.00000 days
Start: 2018-05-27 — End: ?

## 2018-05-27 NOTE — Patient Instructions
-   Lab orders in at [facility]  - Refilled albuterol and valacyclovir  - I suspect your chest pain is related to muscle strain from coughing. I do not think it is related to your heart.

## 2018-05-27 NOTE — Telephone Encounter
Left VM for patient, MD requesting 4 months follow up.

## 2018-05-31 ENCOUNTER — Ambulatory Visit: Payer: BLUE CROSS/BLUE SHIELD

## 2018-06-01 NOTE — Telephone Encounter
Patient pharmacy was called to give the sig they requested and patient was called too.

## 2018-06-02 ENCOUNTER — Ambulatory Visit: Payer: PRIVATE HEALTH INSURANCE | Attending: Ophthalmic Plastic and Reconstructive Surgery

## 2018-06-02 DIAGNOSIS — H00011 Hordeolum externum right upper eyelid: Secondary | ICD-10-CM

## 2018-06-02 NOTE — Progress Notes
Jill Ellis is a 28 y.o. female    Assessment and Plan     Problem List        Eye / Vision Problems    1. Stye     Overview      03/17/18 returns to urgent care because she is worried since the stye   ruptured and drained. Reassured her that it will continue to drain over   next few days and then heal up. Continue warm compresses and massage. No   need for additional oral antibiotics.     Recommended warm compresses, massage and Erythromycin and   FML-fluoromethalone - will see oculoplastic  - patient would like to have   it remove it                 Slit Lamp and Fundus Exam     Slit Lamp Exam       Right Left    Lids/Lashes large chalazion Normal    Conjunctiva/Sclera White and quiet White and quiet    Cornea Clear Clear    Anterior Chamber Deep and quiet Deep and quiet    Iris Normal Normal    Lens Clear Clear          Fundus Exam       Right Left    Disc Normal Normal            Impression:    Ms Robey has a right upper eyelid chalazion. An extensive discussion of the R/B/A of injection with 5-fluorouracil/kenalog-10 was conducted with the patient.  She understood that this is an off-label use of 5-fluorouracil, a chemotherapeutic agent.  She denies any h/o G6PD or other metabolic deficiency.  She understood the risks including, but not limited to, bleeding, infection, scarring, asymmetry, poor cosmesis, need for future additional injections/surgery, skin depigmentation, systemic absorption and toxicity of 5FU, loss of vision, loss of eye, loss of life.  She wished to proceed with surgery.  Consent signed and witnessed.    PROCEDURE NOTE:  We again discussed the R/B/A of injection with 5-fluorouracil/kenalog-10.She understood that this is an off-label use of 5-fluorouracil, a chemotherapeutic agent. Consent signed previously.  A single gtt of proparacaine was instilled in the injection eye. Approximately 0.2 cc of a 1:1 mixture of 5FU:K10 was injected into the lesion without complications. Hemostasis was achieved with manual pressure. Sterile saline was used to irrigate the surface of the eye.  Vision was CF or better immediately post procedure.     Orders:  No orders of the defined types were placed in this encounter.      Follow ups:  Return in about 6 weeks (around 07/14/2018).         I discussed the above assessment and plan with the patient. She had the opportunity to ask questions, and her questions and concerns were addressed. She was reminded to call if there is any significant change or worsening in vision, or to get an evaluation, urgently if appropriate.    Author:   Donell Sievert, MD  This note details the assessment and plan of the encounter for this date. For a complete note and record of the encounter, please see the Encounter Summary.

## 2018-07-18 ENCOUNTER — Ambulatory Visit: Payer: BLUE CROSS/BLUE SHIELD

## 2018-07-21 ENCOUNTER — Ambulatory Visit: Payer: PRIVATE HEALTH INSURANCE | Attending: Ophthalmic Plastic and Reconstructive Surgery

## 2018-08-08 ENCOUNTER — Ambulatory Visit: Payer: BLUE CROSS/BLUE SHIELD | Attending: Plastic and Reconstructive Surgery

## 2018-08-23 ENCOUNTER — Ambulatory Visit: Payer: BLUE CROSS/BLUE SHIELD | Attending: Plastic and Reconstructive Surgery

## 2018-09-26 ENCOUNTER — Ambulatory Visit: Payer: PRIVATE HEALTH INSURANCE

## 2018-10-05 NOTE — Progress Notes
Internal Medicine Outpatient Telemedicine Progress Note    Patient: Jill Ellis  MRN: [id number]  Primary Care Physician: Danielle Dess., MD  Date of Service: 10/06/2018  Location of provider: [phone] office  Location of patient: home  See below for Telehealth consent    History:      Chief Complaint   Patient presents with   ? Anxiety      History of Present Illness/Interval History:  Jill Ellis is a 28 y.o. female with a history of depression and anxiety who presents for follow up care:    Since our last appointment she has started escitalopram and lisdexamfetamine, prescribed by her psychiatrist. She feels that escitalopram has helped with her anxiety and she is not having panic attacks. The lisdexamfetamine is helping with ADHD though overall she feels like it is also helping to wake her up in the morning as she would otherwise sleep for 15 hours. She notes her father and grandmother also feel like they can sleep for long hours.    She was surprised that her Lyme test was negative as she thought this would explain fatigue.    She continues to get tingling pain in her forearms, hands, feet, and calves. It can happen at rest but sometimes when she walks around as well. It can be on either side but more on the right. It tends to happen in one area at a time. It seems like it has occurred more frequently over the past year.    She was having stomach bloating, not sure if this was related to medication. She has previously been told that she is intolerant of dairy. Around the time of the bloating she gained 10 pounds over two weeks. She was having loose, watery stool at the time. It seems to have gone down this week. She has been eating bland food - white rice, pea soup, steamed vegetables.    Her breast implants were recalled by the FDA due to risk of lymphoma, she is planning to have surgery to have them removed in November. She was told that she needs to do pre-operative lab work.      Chronic conditions: [x] Problem list reviewed and updated.  Patient Active Problem List    Diagnosis Date Noted   ? Anxiety 05/26/2018   ? Depression 05/26/2018   ? Stye 03/14/2018     03/17/18 returns to urgent care because she is worried since the stye ruptured and drained. Reassured her that it will continue to drain over next few days and then heal up. Continue warm compresses and massage. No need for additional oral antibiotics.     Recommended warm compresses, massage and Erythromycin and FML-fluoromethalone - will see oculoplastic  - patient would like to have it remove it         Reconciled medication list:  Medications that the patient states to be currently taking   Medication Sig   ? escitalopram 10 mg tablet Take 10 mg by mouth daily.   ? lisdexamfetamine 20 mg capsule Take 20 mg by mouth daily.       Allergies:  No Known Allergies    Additional PMH/FH/SH:  Tobacco:  reports that she has never smoked. She has never used smokeless tobacco. She reports current alcohol use of about 9.6 oz of alcohol per week. She reports current drug use. Drug: Marijuana.  family history includes Alcohol abuse in her mother; Arthritis in her mother; Asthma in her brother; Breast cancer in her maternal grandmother; Depression  in her brother, father, mother, and sister; Heart attack in her paternal grandfather; Mental illness in her mother; Stroke in her paternal grandmother.    ROS:   Review of Systems   Constitutional: Positive for malaise/fatigue. Negative for fever.   Gastrointestinal: Positive for diarrhea.        +bloating   Neurological: Positive for sensory change.   Psychiatric/Behavioral: The patient is nervous/anxious.         Health Maintenance:    Health Maintenance   Topic Date Due   ? Hepatitis C Screening  11/09/2008   ? Influenza Vaccine (1) 09/13/2018   ? Cervical Ca Screening: PAP Smear  11/13/2019   ? Tdap/Td Vaccine (2 - Td) 01/22/2028   ? HIV Screening  Completed   ? HPV Vaccines  Aged Out       Immunization History Administered Date(s) Administered   ? Tdap 01/21/2018       Additional Health Maintenance Items:  None    Physical Exam:      Vitals:    10/06/18 1314   Weight: 125 lb (56.7 kg)       System Check if Normal Positive or additional negative findings   Constit  [x]  General appearance     Eyes  []  Conj/Lids []  Pupils  []  Fundi     ENMT  []  External ears/nose   []  Otoscopy []  Hearing   []  Nasal mucosa []  Oropharynx   []  Lips/teeth/gums     Neck  []  Inspection/palpation   []  Thyroid     Resp  [x]  Effort []  Percussion   []  Auscultation     CV  []  Auscultation []  Lower extremities    []  Abd aorta []  Femoral   []  Pedal     Breast  []  Inspection []  Palpation     GI  []  Normoactive bowel sounds    []  Abd (masses or tenderness)   []  Liver/spleen []  Rectal     GU  M: []  Scrotum []  Penis []  Prostate   F:  []  External []  Bladder   []  Cervix  []  Uterus []  Adnexa      Lymph  []  Neck []  Axillae []  Groin     MS  []  Gait []  Digits/nails  Specify site examined:    []  Inspect/palp []  ROM   []  Stability []  Strength/tone         Skin  []  Inspection []  Palpation     Neuro  []  CN2-12 []  DTRs     []  Sensation     Psych  [x]  Insight/judgement   [x]  Orientation [x]  Memory   [x]  Mood/affect         Data:   I have     [] reviewed/ordered radiology, [] reviewed/ordered labs, [] reviewed/ordered diag med test   []  independent interpretation of study, [] decided to get outside medical records    [] reviewed & summarized old records or obtain from someone other than patient [] obtained history from someone other than patient [] review test with performing physician   Lab Review:   No results found for: TSH   Lab Results   Component Value Date    WBC 4.6 01/25/2018    HGB 14.4 01/25/2018    HCT 41.3 01/25/2018    MCV 94.5 01/25/2018    PLT 284 01/25/2018     Lab Results   Component Value Date    CREAT 0.66 01/25/2018    BUN 10 01/25/2018    NA 139 01/25/2018    K 4.1  01/25/2018     No results found for: CHOL, CHOLHDL, CHOLDLQ, CHOLDLCAL, TRIGLY Lab Results  (Last 360 days)    Result      4.7  % of total Hgb 07/11/18 1505          Studies:  No recent imaging/studies    Assessment/Plan:      Jill Ellis is a 28 y.o. female with a history of depression and anxiety who presents for management of the following medical conditions:    Diagnoses and all orders for this visit:    Anxiety: Improved on escitalopram, no longer having panic attacks.  - continue escitalopram 10 mg daily as per psychiatrist    Fatigue, unspecified type: Unclear cause, though improved since she started on lisdexamfetamine per her psychiatrist. Consider sleep cycle disorder as her father has similar symptoms. Lyme testing negative.  - Continue lisdexamfetamine 20 mg daily per psychiatry    Abdominal bloating: Unclear etiology. Consider possibility of constipation with overflow diarrhea given associated loose stool at the time. Symptoms have improved.  -     Referral to Gastroenterology    IUD (intrauterine device) in place: Place in early 2019, she is happy with it. Interested in seeing gynecologist for routine care.  -     Referral to Obstetrics & Gynecology    Paresthesia: Unclear etiology, migratory. REcent testing with no abnormalities to suggest thyroid disease, STI, and Lyme test was negative. Low concern for central neurologic lesion causing symptoms.  - Continue to monitor  - If symptoms continue consider referral to Neurology    Preop testing: Upcoming surgery for breast implant removal with outside surgeon.  -     CBC & Auto Differential; Future  -     Basic Metabolic Panel; Future  -     HIV-1/2 Ag/Ab 4th Generation with Reflex Confirmation; Future  -     HCV Antibody Screen; Future  -     Urinalysis,Routine; Future  -     Prothrombin Time Panel; Future  -     APTT; Future  -     HBS Antigen; Future  -     Pregnancy Test,Urine; Future    Other orders  -     valACYclovir 500 mg tablet; Take 1 tablet (500 mg total) by mouth daily. Follow-up: Return in about 4 months (around 02/05/2019), or if symptoms worsen or fail to improve, for fatigue, paresthesias.    The above plan of care, diagnosis, orders, and follow-up were discussed with the patient.  Questions related to this recommended plan of care were answered.  See AVS for additional information and counseling materials provided to the patient.    Time of note filed does not necessarily reflect the time of encounter.    Signed, Chandell Attridge C. Aundria Rud, MD 10/06/2018 10:18 PM    Patient Consent to Telehealth Questionnaire   [facility] TELEHEALTH PRECHECKIN QUESTIONS 10/06/2018   By clicking ''I Agree'', I consent to the below:  I Agree     - I agree to be treated via a video visit and acknowledge that I may be liable for any relevant copays or coinsurance depending on my insurance plan.  - I understand that this video visit is offered for my convenience and I am able to cancel and reschedule for an in-person appointment if I desire.  - I also acknowledge that sensitive medical information may be discussed during this video visit appointment and that it is my responsibility to locate myself in a location that ensures  privacy to my own level of comfort.  - I also acknowledge that I should not be participating in a video visit in a way that could cause danger to myself or to those around me (such as driving or walking).  If my provider is concerned about my safety, I understand that they have the right to terminate the visit.

## 2018-10-06 ENCOUNTER — Ambulatory Visit: Payer: BLUE CROSS/BLUE SHIELD

## 2018-10-06 ENCOUNTER — Telehealth: Payer: PRIVATE HEALTH INSURANCE

## 2018-10-06 DIAGNOSIS — Z975 Presence of (intrauterine) contraceptive device: Secondary | ICD-10-CM

## 2018-10-06 DIAGNOSIS — R5383 Other fatigue: Secondary | ICD-10-CM

## 2018-10-06 DIAGNOSIS — Z01818 Encounter for other preprocedural examination: Secondary | ICD-10-CM

## 2018-10-06 DIAGNOSIS — R140 Abdominal distension (gaseous): Secondary | ICD-10-CM

## 2018-10-06 DIAGNOSIS — R202 Paresthesia of skin: Secondary | ICD-10-CM

## 2018-10-06 MED ORDER — VALACYCLOVIR HCL 500 MG PO TABS
500 mg | ORAL_TABLET | Freq: Every day | ORAL | 2 refills | 10.00000 days | Status: AC
Start: 2018-10-06 — End: ?

## 2018-10-06 NOTE — Patient Instructions
-   Okay to keep an eye on the tingling for now  - I put in a gastroenterology referral, can see Dr. Epifanio Lesches, Dr. Charlyne Quale in [facility] or Dr. Bennie Hind, Dr. Levada Schilling in [place]  - For gynecology I would suggest Dr. Bretta Bang, Dr. Maryfrances Bunnell, Dr. Trevor Mace in [place] me a message with the pre-op labs your surgeon is requesting, okay to just screenshot this and attach it

## 2018-10-11 ENCOUNTER — Ambulatory Visit: Payer: BLUE CROSS/BLUE SHIELD

## 2018-10-14 ENCOUNTER — Ambulatory Visit: Payer: PRIVATE HEALTH INSURANCE

## 2018-10-31 NOTE — Progress Notes
[facility]  Immediate Care Progress Note        Date: 10/30/2018    Primary Care Provider: Danielle Dess., MD    Chief Complaint: left leg insect bites      SUBJECTIVE:     Jill Ellis is a 28 y.o. female who presents today for evaluation of the following acute issues:    Pt stated that she was at the beach last night and thinks she got bit by a bug. She has 2 bug bites on her left knee and 1 on her left calf and they are itchy, red, swollen and associated with swelling around the knee and pain while walking. She denies any fevers/chills, headache, cp, sob, throat pain/swelling, abd pain, n/v/d, rashes. She was bit by a spider last year and her leg became very swollen - this is not like that she says. Took benadryl today      Past Medical History:   Diagnosis Date   ? Anxiety 2011   ? Asthma 2007   ? Depression 2011   ? Eye infection    ? Headache 2007       Medication:   Outpatient Medications Prior to Visit   Medication Sig Dispense Refill   ? albuterol 90 mcg/act inhaler Inhale 2 puffs every six (6) hours as needed. 8.5 g 1   ? ALPHA LIPOIC ACID PO Take by mouth.     ? cotrimoxazole DS 800-160 mg tablet      ? escitalopram 10 mg tablet Take 10 mg by mouth daily.     ? Ferrous Sulfate (IRON PO) Take by mouth.     ? lisdexamfetamine 20 mg capsule Take 20 mg by mouth daily.     ? MELATONIN PO Take by mouth.     ? multivitamin tablet Take by mouth daily.     ? mupirocin 2% ointment      ? Omega-3 Fatty Acids (FISH OIL PO) Take by mouth.     ? Probiotic Product (PROBIOTIC PO) Take by mouth.     ? UNABLE TO FIND Med Name: [employer] .     ? valACYclovir 500 mg tablet Take 1 tablet (500 mg total) by mouth daily. 90 tablet 2     No facility-administered medications prior to visit.        Allergies: No Known Allergies    Review of Systems:   ROS otherwise negative.     PHYSICAL EXAM:     BP 108/69  ~ Pulse 63  ~ Temp 36.6 ?C (97.9 ?F) (Tympanic)  ~ Resp 19  ~ SpO2 97% Gen: No acute distress, alert & oriented, pleasant and cooperative  HEENT: PERRLA, EOMI  Lymph: No lymphadenopathy   MSK/Skin: full range of motion in bilateral lower extremities, strength 5/5 b/l LE, sensation intact. 2 bug bites on knee and 1 on calf - mildly erythematous raised bite. Mild swelling around knee. No signs of cellulitis/abrasions.  Neuro: No focal neurologic defects. Antalgic gait     STUDIES:         ASSESSMENT & PLAN:     Devaney Segers is a 28 y.o. female who presents today with the following acute issues:    1. Bug bite without infection, initial encounter  Appears like small bug bites on knee/leg which are puritic. No signs of cellulitis. Afebrile.   - advised rest, topical benadryl or hydrocortisone for the itching, avoid scratching so as not to infect site, ice.     - return if  symptoms worsen or do not improve       There are no diagnoses linked to this encounter.    Follow-up: No follow-ups on file.    The above plan was explained and reviewed extensively with the patient.  All questions were answered.    Jill Reil, MD   Internal Medicine  [facility]

## 2018-10-31 NOTE — Patient Instructions
Insect Bite  Insects most often bite to protect themselves or their nests. Certain bugs, like fleas and mosquitoes, bite to feed. In some cases, the actual bite causes no pain. An itchy red welt or swelling may develop at the site of the bite. Most insect bites do not cause illness. And the itching and swelling most often go away without treatment. However, an infection can develop if the bite is scratched and the skin broken. Rarely, a person may have an allergic reaction to an insect bite.  If a stinger is visible at the bite spot, remove it as quickly as possible, as this can decrease the amount of venom that gets into your body. Scrape it out with a dull edge, such as the edge of a credit card. Try not to squeeze it. Do not try to dig it out, as you may damage the skin and also increase the chance of infection.     To help reduce swelling and itching, apply a cold pack or ice in a zip-top plastic bag wrapped in a thin towel.   Home care   Your healthcare provider may prescribe over-the-counter medicines to help relieve itching and swelling. Use each medicine according to the directions on the package. If the bite becomes infected, you will need an antibiotic. This may be in pill form taken by mouth or as an ointment or cream put directly on the skin. Be sure to use them exactly as prescribed.   Bite symptoms usually go away on their own within a week or two.   To help prevent infection, avoid scratching or picking at the bite.   To help relieve itching and swelling, apply ice in a zip-top plastic bag wrapped in a thin towel to the bites. Do this for up to 10 minutes at a time. Avoid hot showers or baths as these tend to make itching worse.   An over-the-counter anti-itch medicine such as calamine lotion or an antihistamine cream may be helpful.   If you suspect you have insects in your home, talk to a licensed pest-control professional. He or she can inspect your home and tell you how to get rid of bugs  safely.  Follow-up care  Follow up with your healthcare provider, oras advised.  Call 911  Call 911 if any of these occur:   Trouble breathing or swallowing   Wheezing   Feeling like your throat is closing up   Fainting, loss of consciousness   Swelling around the face or mouth  When to seek medical advice  Call your healthcare provider right away if any of these occur:   Fever of 100.4F (38C) or higher, or as directed by your healthcare provider   Signs of infection, such as increased swelling and pain, warmth, red streaks, or drainage from the skin   Signs of allergic reaction, such as hives, a spreading rash, or throat itching  Date Last Reviewed: 10/13/2014   2000-2018 [employer] [address], [place] [address]. All rights reserved. This information is not intended as a substitute for professional medical care. Always follow your healthcare professional's instructions.

## 2019-01-23 ENCOUNTER — Ambulatory Visit: Payer: PRIVATE HEALTH INSURANCE

## 2019-01-24 MED ORDER — MONTELUKAST SODIUM 10 MG PO TABS
10 mg | ORAL_TABLET | Freq: Every evening | ORAL | 1 refills | 30.00000 days | Status: AC
Start: 2019-01-24 — End: ?

## 2019-01-29 ENCOUNTER — Ambulatory Visit: Payer: BLUE CROSS/BLUE SHIELD

## 2019-02-14 ENCOUNTER — Ambulatory Visit: Payer: BLUE CROSS/BLUE SHIELD

## 2019-02-15 MED ORDER — MONTELUKAST SODIUM 10 MG PO TABS
10 mg | ORAL_TABLET | Freq: Every evening | ORAL | 1 refills | 30.00000 days | Status: AC
Start: 2019-02-15 — End: ?

## 2019-02-23 ENCOUNTER — Ambulatory Visit: Payer: BLUE CROSS/BLUE SHIELD

## 2019-03-02 ENCOUNTER — Ambulatory Visit: Payer: BLUE CROSS/BLUE SHIELD

## 2019-03-13 ENCOUNTER — Telehealth: Payer: BLUE CROSS/BLUE SHIELD

## 2019-03-13 NOTE — Telephone Encounter
lvm to reschedule appt on 05/11/19. Contact info given. CM

## 2019-04-10 ENCOUNTER — Ambulatory Visit: Payer: PRIVATE HEALTH INSURANCE

## 2019-04-11 MED ORDER — VALACYCLOVIR HCL 500 MG PO TABS
500 mg | ORAL_TABLET | Freq: Every day | ORAL | 2 refills | 10.00000 days | Status: AC
Start: 2019-04-11 — End: ?

## 2019-05-01 ENCOUNTER — Ambulatory Visit: Payer: PRIVATE HEALTH INSURANCE

## 2019-05-11 ENCOUNTER — Ambulatory Visit: Payer: BLUE CROSS/BLUE SHIELD

## 2019-05-16 ENCOUNTER — Ambulatory Visit: Payer: PRIVATE HEALTH INSURANCE

## 2019-05-26 ENCOUNTER — Ambulatory Visit: Payer: BLUE CROSS/BLUE SHIELD

## 2019-05-26 DIAGNOSIS — J069 Acute upper respiratory infection, unspecified: Secondary | ICD-10-CM

## 2019-05-26 NOTE — Progress Notes
[facility] IMMEDIATE CARE  DOV: 05/26/2019      SUBJECTIVE:   Hx HA  1.5 weeks of fatigue, a/w dry cough that's worse at night, and frontal HA.    Hx of asthma, been having SOB with light house duty that does not improve w inhaler.  Denies runny nose/sneezing.    Been taking midol, sudafed, antihistamines.   Frontal HA worsens when leaning forward.  No HA red flags.   Denies GI sx   Had Covid in jan  On Vyvanse.  Currently transitioning to Zoloft    ROS: other ROS are non-contributory/neg    PMH Reviewed  PSH Reviewed  ALLX Reviewed  MEDS Reviewed   reports that she has never smoked. She has never used smokeless tobacco. She reports current alcohol use of about 9.6 oz of alcohol per week. She reports current drug use. Drug: Marijuana.    OBJECTIVE:     VS: BP 118/80  ~ Pulse 80  ~ Temp 36.4 C (97.6 F) (Tympanic)  ~ Resp 20  ~ Ht 5' 7'' (1.702 m)  ~ Wt 135 lb (61.2 kg)  ~ SpO2 98%  ~ BMI 21.14 kg/m   GEN: alert, NAD, nontoxic app  Eyes: Normal external eye, conjunctiva, lids, cornea. PERRL.  Ears: Normal TM's B.  Normal auditory canals and external ears. Non-tender.  Sinuses: sinuses TTP  Throat: moist mucosa, normal, No enlarged tonsils/exudates  Neck: No cervical adenopathy, thyroid not enlarged, no masses  LUNGS: CTAB, no C/R/R, comf on RA, speaking full sentences  CVS: RRR, S1/S2 normal, no M/G/R  EXT: MAEW, no cyanosis or edema  SKIN: No rashes or lesions.   NEURO: Grossly normal.  Gait is normal.        ASSESSMENT & PLAN     1. Upper respiratory tract infection, unspecified type  DDx: viral URI, ABRS, Influenza, Bronchitis  - XR chest pa+lat (2 views); Future  - Augmentin for ABRS  - ipratropium     All questions were answered.  French Ana, MD

## 2019-05-27 LAB — COVID-19 PCR

## 2019-05-27 MED ORDER — IPRATROPIUM BROMIDE 0.03 % NA SOLN
2 | Freq: Two times a day (BID) | NASAL | 0 refills | Status: AC
Start: 2019-05-27 — End: ?

## 2019-05-27 MED ORDER — AMOXICILLIN-POT CLAVULANATE 875-125 MG PO TABS
1 | ORAL_TABLET | Freq: Two times a day (BID) | ORAL | 0 refills | Status: AC
Start: 2019-05-27 — End: ?

## 2019-05-27 NOTE — Addendum Note
Addended by: Tama Headings on: 05/26/2019 07:05 PM     Modules accepted: Orders

## 2019-05-27 NOTE — Addendum Note
Addended byAbram Sander on: 05/26/2019 07:05 PM     Modules accepted: Orders, SmartSet

## 2019-06-06 ENCOUNTER — Ambulatory Visit: Payer: PRIVATE HEALTH INSURANCE

## 2019-06-07 ENCOUNTER — Ambulatory Visit: Payer: PRIVATE HEALTH INSURANCE

## 2019-06-07 ENCOUNTER — Ambulatory Visit: Payer: BLUE CROSS/BLUE SHIELD

## 2019-06-20 MED ORDER — IPRATROPIUM BROMIDE 0.03 % NA SOLN
2 | Freq: Two times a day (BID) | NASAL
Start: 2019-06-20 — End: ?

## 2019-06-21 ENCOUNTER — Ambulatory Visit: Payer: PRIVATE HEALTH INSURANCE

## 2019-06-21 DIAGNOSIS — Z113 Encounter for screening for infections with a predominantly sexual mode of transmission: Secondary | ICD-10-CM

## 2019-06-21 DIAGNOSIS — Z01419 Encounter for gynecological examination (general) (routine) without abnormal findings: Secondary | ICD-10-CM

## 2019-06-21 NOTE — H&P
Internal Medicine Outpatient History and Physical Exam    Patient: Jill Ellis  MRN: [id number]  Primary Care Physician: Danielle Dess., MD  Date of Service: 06/22/2019       History:     Present in the exam room: Patient     Chief Complaint   Patient presents with   ??? Annual Exam     History of Present Illness:  Jill Ellis is a 29 y.o. female with a history of depression and anxiety who presents for annual wellness visit and for management of the following problems:    She woke up this morning with a sore throat. Her roommate had Strep throat last week. Her asthma has also been flaring recently. No fevers. She had a headache yesterday.    She is seeing a therapist and psychiatrist. She recently changed from escitalopram to Zoloft. She is taking Vyvanse as well.    She has gained weight in the past year about 20 pounds, worried about thyroid, adrenal function. Her menstrual cycles had been regular up until this month, now a couple weeks late. She met a new gynecologist yesterday.    She was having frequent loose stools but better in the past several weeks. Her symptoms seem to come and go. Her siblings have been diagnosed with IBS.    She moved a few months ago, living with her boyfriend. She was drinking alcohol daily during the pandemic, now reducing alcohol during the week. She does drink on weekends. She is not currently working.     Health Maintenance Reviewed:    Health Maintenance   Topic Date Due   ??? Hepatitis C Screening  Never done   ??? Influenza Vaccine (Season Ended) 09/13/2019   ??? Cervical Ca Screening: PAP Smear  11/13/2019   ??? Tdap/Td Vaccine (2 - Td) 01/22/2028   ??? HIV Screening  Completed   ??? HPV Vaccines  Aged Out     Immunization History   Administered Date(s) Administered   ??? Tdap 01/21/2018       2018 ACC/AHA guidelines recommends that patient is not in statin benefit group. Encourage adherence to heart-healthy lifestyle.    10-year ASCVD risk  cannot be calculated because at least one required variable is not available in [facility]  as of 11:35 AM on 06/22/2019  10-year ASCVD risk with optimal risk factors cannot be calculated.  Values used to calculate ASCVD score:  Age: 29 y.o. Cannot calculate risk because age is not between 38 and 33 years old.  Gender: Female Race: Not African American.  Cannot calculate risk because HDL cholesterol not documented within the past 5 years.    Cannot calculate risk because Total cholesterol not documented within the past 5 years.    LDL cholesterol not documented within the past 5 years.    Systolic BP: 109 mm Hg. BP was measured today.  The patient is not being treated with a medication that influences SBP.  The patient is currently not a smoker.  The patient does not have a diagnosis of diabetes.    Click here for the [facility] ASCVD Cardiovascular Risk Estimator Plus tool Office manager).    Additional Health Maintenance:  None    Chronic conditions (Reviewed/Updated Problem List):  Patient Active Problem List    Diagnosis Date Noted   ??? Anxiety 05/26/2018   ??? Depression 05/26/2018   ??? Stye 03/14/2018     03/17/18 returns to urgent care because she is worried since the stye ruptured and drained.  Reassured her that it will continue to drain over next few days and then heal up. Continue warm compresses and massage. No need for additional oral antibiotics.     Recommended warm compresses, massage and Erythromycin and FML-fluoromethalone - will see oculoplastic  - patient would like to have it remove it         Past Medical History:  Past Medical History:   Diagnosis Date   ??? Anxiety 2011   ??? Asthma 2007   ??? Depression 2011   ??? Eye infection    ??? Headache 2007        Past Surgical History:  Past Surgical History:   Procedure Laterality Date   ??? BREAST SURGERY  2015    breast augmentation   ??? COSMETIC SURGERY  2015    rhinoplasty   ??? SINUS SURGERY  2002    adenoids removed        Medications:  Medications that the patient states to be currently taking   Medication Sig   ??? albuterol 90 mcg/act inhaler Inhale 2 puffs every six (6) hours as needed.   ??? ALPHA LIPOIC ACID PO Take by mouth.   ??? Ferrous Sulfate (IRON PO) Take by mouth.   ??? ipratropium 0.03% nasal spray Spray 2 sprays in each nare every twelve (12) hours.   ??? lisdexamfetamine 20 mg capsule Take 20 mg by mouth daily.   ??? MELATONIN PO Take by mouth.   ??? montelukast 10 mg tablet Take 1 tablet (10 mg total) by mouth every evening.   ??? multivitamin tablet Take by mouth daily.   ??? Omega-3 Fatty Acids (FISH OIL PO) Take by mouth.   ??? Probiotic Product (PROBIOTIC PO) Take by mouth.   ??? valACYclovir 500 mg tablet Take 1 tablet (500 mg total) by mouth daily.   sertraline 125 mg  Advair     OTC/supplements: as above    Allergies:  No Known Allergies       SOCIAL HISTORY:   reports that she has never smoked. She has never used smokeless tobacco. She reports current alcohol use of about 9.6 oz of alcohol per week. She reports current drug use. Drug: Marijuana.  Additional Social History: She lives with her boyfriend. She is not currently winning.    FAMILY HISTORY: family history includes Alcohol abuse in her mother; Arthritis in her mother; Asthma in her brother; Breast cancer in her maternal grandmother; Depression in her brother, father, mother, and sister; Heart attack in her paternal grandfather; Mental illness in her mother; Stroke in her paternal grandmother.    Review of Systems:   Review of Systems   Constitutional: Positive for malaise/fatigue. Negative for fever.   HENT: Positive for sore throat. Negative for congestion and ear pain.    Respiratory: Negative for shortness of breath.    Cardiovascular: Negative for chest pain.   Gastrointestinal: Negative for diarrhea.   Genitourinary: Negative for dysuria.   Neurological: Positive for headaches.   Psychiatric/Behavioral: Positive for depression. The patient is nervous/anxious.           Physical Exam:      Vitals:   BP 109/72  ~ Pulse 71  ~ Temp 36.6 ???C (97.8 ???F) (Forehead)  ~ Ht 5' 7'' (1.702 m)  ~ Wt 136 lb 6.4 oz (61.9 kg)  ~ SpO2 96%  ~ BMI 21.36 kg/m???     Wt Readings from Last 3 Encounters:   06/22/19 136 lb 6.4 oz (61.9 kg)   06/21/19  136 lb (61.7 kg)   05/26/19 135 lb (61.2 kg)       A trained chaperone was present for this exam/procedure: No  Reason why a trained chaperone was not present: Patient declined      System Check if Normal Positive or additional negative findings   Constit  [x]  General appearance     Eyes  [x]  Conj/Lids [x]  Pupils  []  Fundi     ENMT  [x]  External ears/nose   [x]  Otoscopy [x]  Hearing   [x]  Nasal mucosa []  Oropharynx   []  Lips/teeth/gums  minimal posterior OP erythema   Neck  []  Inspection/palpation   [x]  Thyroid     Resp  [x]  Effort []  Percussion   [x]  Auscultation     CV  [x]  Auscultation [x]  Lower extremities    []  Abd aorta []  Femoral    []  Pedal     Breast  []  Inspection []  Palpation     GI  [x]  Normoactive bowel sounds     [x]  Abd (masses or tenderness)   [x]  Liver/spleen []  Rectal     GU  M: []  Scrotum []  Penis   []  Prostate   F:  []  External []  Bladder   []  Cervix []  Uterus []  Adnexa      Lymph  [x]  Neck []  Axillae []  Groin     MS  [x]  Gait []  Digits/nails  Specify site examined:    []  Inspect/palp []  ROM   []  Stability []  Strength/tone         Skin  []  Inspection []  Palpation     Neuro  []  CN2-12 [x]  DTRs    []  Sensation     Psych  [x]  Insight/judgement   [x]  Orientation [x]  Memory   []  Mood/affect       PHQ-9 Results  Depression Screening (Patient Health Questionnaire PHQ) 01/21/2018 05/26/2018 06/22/2019   PHQ-2: Feeling down, depressed, or hopeless No - -   PHQ-2: Little interest or pleassure in doing things No - -   Little interest or pleasure in doing things Several days Not at all Several days   Feeling down, depressed, or hopeless Several days Not at all Several days   Trouble falling or staying asleep, or sleeping too much Nearly every day Nearly every day Nearly every day   Feeling tired or having little energy Nearly every day Nearly every day Nearly every day   Poor appetite or overeating Several days Not at all Several days   Feeling bad about yourself - or that you are a failure or have let yourself or your family down More than half the days Not at all Several days   Trouble concentrating on things, such as reading the newspaper or watching television More than half the days More than half the days More than half the days   Moving or speaking so slowly Or being so fidgety or restless Not at all Not at all Several days   Thoughts that you would be better off dead, or of hurting yourself in some way Not at all Not at all Not at all   Total Score 13 8 13        GAD-7 Results  GAD-7 01/21/2018 05/26/2018 06/22/2019   Feeling nervous, anxious, or on edge 3-Nearly every day 3-Nearly every day 2-More than half the days   Not being able to stop or control worrying 3-Nearly every day 3-Nearly every day 2-More than half the days   Worrying too much about different things  3-Nearly every day 3-Nearly every day 2-More than half the days   Trouble relaxing 2-More than half the days 3-Nearly every day 3-Nearly every day   Being so restless that is hard to sit still 0-Not at all 0-Not at all 0-Not at all   Becoming easily annoyed or irritable 2-More than half the days 1-Several days 1-Several days   Feeling afraid, as if something awful might happen 3-Nearly every day 1-Several days 1-Several days   Total Score 16 14 11        Data:   I have:   [x]  Reviewed/ordered []  1 []  2 [x]  ? 3 unique laboratory, radiology, and/or diagnostic tests noted below    []  Reviewed []  1 []  2 []  ? 3 prior external notes and incorporated into patient assessment    []  Discussed management or test interpretation with external provider(s) as noted       Lab Studies:  Rapid strep: negative  Last COVID swab:   Results for orders placed or performed in visit on 05/26/19   COVID-19 PCR, (Supv) Mid-turbinate    Specimen: (Supv) Mid-turbinate; Respiratory, Upper   Result Value Ref Range    Specimen Type Respiratory, Upper     COVID-19 PCR Not Detected Not Detected    Narrative    This test is intended for in vitro diagnostic use under FDA Emergency Use Authorization only. The [facility] is certified under the Clinical Laboratory Improvement Amendments of 1988 (CLIA-88) as qualified to perform high complexity clinical laboratory testing.          Imaging Studies:   Last CXR, PA/L: Xr Chest Pa+lat (2 Views)    Result Date: 05/26/2019  IMPRESSION: No acute cardiopulmonary disease .  Marland Kitchen Signed by: Aura Dials   05/26/2019 7:36 PM      Assessment/Plan:      Jill Ellis is a 29 y.o. female who presents for screening, prevention, and management of medical conditions:    Routine general medical examination at a health care facility: Up to date with age-appropriate cancer screenings and vaccines. She walks for exercise. She drinks on weekends, working on reducing alcohol during the week.  -     Cancel: TSH with reflex FT4, FT3; Future  -     Cancel: Comprehensive Metabolic Panel; Future  -     Cancel: CBC; Future  -     Cancel: Hgb A1c; Future  -     CBC; Future  -     Comprehensive Metabolic Panel; Future  -     Hgb A1c; Future  -     TSH with reflex FT4, FT3; Future    Sore throat: Rapid strep returned negative, no significant erythema on examination. She does have recent exposure to Strep.  -     POCT rapid strep A  - Trial Sudafed, salt water gargles  - If no improvement consider empiric treatment for Strep throat    Chronic fatigue: Ongoing, at one point told she had post-Lyme syndrome though subsequent testing negative. Will check for hypothyroidism, Cushing's. Also consider related to depression.  -     Cancel: TSH with reflex FT4, FT3; Future  -     Cancel: Cortisol; Future  -     dexamethasone 1 mg tablet; Take 1 tablet (1 mg total) by mouth once Take approximately 11pm the night before blood drawn around 8am for 1 dose.  -     Cortisol; Future  -     TSH with  reflex FT4, FT3; Future    Need for hepatitis C screening test  -     Cancel: HCV Antibody Screen; Future  -     HCV Antibody Screen; Future       Return in about 1 year (around 06/21/2020), or if symptoms worsen or fail to improve, for annual preventive/CPE.    No future appointments.    The above plan of care, diagnosis, orders, and follow-up were discussed with the patient.  Questions related to this recommended plan of care were answered.  See AVS for additional information and counseling materials provided to the patient.    Time of note filed does not necessarily reflect the time of encounter.    Signed, Argus Caraher C. Aundria Rud, MD 06/22/2019 9:28 PM

## 2019-06-21 NOTE — Progress Notes
PATIENT: Jill Ellis  MRN: [id number]  DOB: 03/14/1990  DATE OF SERVICE: 06/21/2019    REFERRING PRACTITIONER: Kathrynn Speed., MD  PRIMARY CARE PROVIDER: Danielle Dess., MD    CHIEF COMPLAINT:   Chief Complaint   Patient presents with   ??? Annual Exam       Subjective:       History of Present Illness:  Jill Ellis is a 29 y.o. G93P0010 female who presents for well woman exam and to establish GYN care at [facility].    Went off OCP 2014. Had ovarian cyst rupture afterwards on right side, admitted to hospital for pain control. No surgery needed.    Then had an unwanted pregnancy at 6-7 weeks in March 2019 while not on contraception. Had abortion and then [place] IUD placed. Happy with it overall.      She has no other gynecologic concerns.  She is sexually active, in monogamous relationship    Menstrual History:  Does bleeding occur between periods?: Yes    Age at first period (years):: 14    Regular periods: Start every how many days?: 28    Irregular periods: Start every how many days?:     How long do periods last (in days):: 5    Does bleeding occur after intercourse?: Yes    Is pain associated with periods?: No       Pregnancy History:   OB History   Gravida Para Term Preterm AB Living   1 0 0 0 1 0   SAB TAB Ectopic Multiple Live Births   0 1 0 0 0      # Outcome Date GA Lbr Len/2nd Weight Sex Delivery Anes PTL Lv   1 TAB 2019 [redacted]w[redacted]d    TAB           Last pap smear: unsure    Last mammogram: N/A    Past Medical History:   Diagnosis Date   ??? Anxiety 2011   ??? Asthma 2007   ??? Depression 2011   ??? Eye infection    ??? Headache 2007     Past Surgical History:   Procedure Laterality Date   ??? BREAST SURGERY  2015    breast augmentation   ??? COSMETIC SURGERY  2015    rhinoplasty   ??? SINUS SURGERY  2002    adenoids removed     Outpatient Medications Prior to Visit   Medication Sig   ??? albuterol 90 mcg/act inhaler Inhale 2 puffs every six (6) hours as needed.   ??? ALPHA LIPOIC ACID PO Take by mouth.   ??? cotrimoxazole DS 800-160 mg tablet    ??? escitalopram 10 mg tablet Take 10 mg by mouth daily.   ??? Ferrous Sulfate (IRON PO) Take by mouth.   ??? ipratropium 0.03% nasal spray Spray 2 sprays in each nare every twelve (12) hours.   ??? lisdexamfetamine 20 mg capsule Take 20 mg by mouth daily.   ??? MELATONIN PO Take by mouth.   ??? montelukast 10 mg tablet Take 1 tablet (10 mg total) by mouth every evening.   ??? multivitamin tablet Take by mouth daily.   ??? mupirocin 2% ointment    ??? Omega-3 Fatty Acids (FISH OIL PO) Take by mouth.   ??? Probiotic Product (PROBIOTIC PO) Take by mouth.   ??? UNABLE TO FIND Med Name: [employer]ogen .   ??? valACYclovir 500 mg tablet Take 1 tablet (500 mg total) by mouth daily.  No facility-administered medications prior to visit.      No Known Allergies  Family History   Problem Relation Age of Onset   ??? Alcohol abuse Mother    ??? Arthritis Mother         rheumatoid arthritis   ??? Depression Mother    ??? Mental illness Mother         bipolar 2 / mania   ??? Asthma Brother    ??? Depression Brother    ??? Depression Father    ??? Depression Sister    ??? Stroke Paternal Grandmother    ??? Breast cancer Maternal Grandmother    ??? Heart attack Paternal Grandfather      Social History     Socioeconomic History   ??? Marital status: Single     Spouse name: Not on file   ??? Number of children: Not on file   ??? Years of education: Not on file   ??? Highest education level: Not on file   Occupational History   ??? Not on file   Social Needs   ??? Financial resource strain: Not on file   ??? Food insecurity     Worry: Not on file     Inability: Not on file   ??? Transportation needs     Medical: Not on file     Non-medical: Not on file   Tobacco Use   ??? Smoking status: Never Smoker   ??? Smokeless tobacco: Never Used   ??? Tobacco comment: tried once or twice, hated it   Substance and Sexual Activity   ??? Alcohol use: Yes     Alcohol/week: 9.6 oz     Types: 14 Glasses of Wine (5 oz), 2 Shots of Liquor (1.5 oz) per week     Comment: binge episodes on weekends   ??? Drug use: Yes     Types: Marijuana     Comment: recreational thc/cbd to sleep   ??? Sexual activity: Yes     Partners: Male     Birth control/protection: IUD (hormonal)   Lifestyle   ??? Physical activity     Days per week: Not on file     Minutes per session: Not on file   ??? Stress: Not on file   Relationships   ??? Social Wellsite geologist on phone: Not on file     Gets together: Not on file     Attends religious service: Not on file     Active member of club or organization: Not on file     Attends meetings of clubs or organizations: Not on file     Relationship status: Not on file   Other Topics Concern   ??? Do you exercise at least a day, 3 or more days a week? Yes   ??? Types of Exercise? (List in Comments) Yes     Comment: hot yoga   ??? Do you follow a special diet? Yes     Comment: avoids dairy, gluten   ??? Vegan? No   ??? Vegetarian? No   ??? Pescatarian? No   ??? Lactose Free? No   ??? Gluten Free? No   ??? Omnivore? No   Social History Narrative   ??? Not on file       Occupation: currently unemployed    Review of Systems:  All 14 point review of systems reviewed by me and negative other than what is listed in HPI  Objective:      Physical Exam:  BP (P) 118/82  ~ Pulse (P) 80  ~ Ht 5' 7'' (1.702 m)  ~ Wt 136 lb (61.7 kg)  ~ BMI 21.30 kg/m???   General: WDWN female in NAD  HEENT: PERRLA, EOMI, NCAT, Neck supple, no thyromegaly, no palpable masses  Repiratory: lungs CTAB  Cardiovascular: heart RRR  Breasts: no skin/nipple changes, soft, NTTP, no palpable masses, breast implants present  Abdomen: +BS, soft, NTTP, ND, no hepatosplenomegaly, no palpable masses/hernias  Lymphatic: no head/neck, axillary, or inguinal LAD  Skin: no rashes/lesions  Neurologic/Psychiatric: A&O x 4, mood/affect appropriate  Pelvic:  - External genitalia: normal in appearance, no skin changes, no lesions/masses  - Urethra/bladder: urethral meatus normal in location and appearance, urethra/bladder NTTP with no palpable masses  - Vagina: moist/pink/rugated, no lesions/masses, no blood/abnormal discharge  - Cervix: closed os, no lesions/masses, NTTP  - Uterus: 8 wk size, anteverted position, smooth contour, NTTP, no palpable masses  - Adnexa/parametria: NTTP, no palpable masses  - Anus/perineum: no skin changes, no lesions/masses      Assessment/Plan    Jill Ellis is a 28 y.o. female G1P0010 with:    1. Well woman exam with routine gynecological exam  WWE  Health Maintenance   Topic Date Due   ??? Routine Hepatitis C Screening (recommended at least once for patients 68-38 years old)  Never done   ??? Flu Vaccine (Season Ended) 09/13/2019   ??? Cervical Cancer Screening: Pap Smear  11/13/2019   ??? Tdap/Td Vaccine (2 - Td) 01/22/2028   ??? Routine HIV Screening (recommended at least once for patients 107-14 years old)  Completed   ??? HPV Vaccines  Aged Out     - Physical exam and CBE WNL  - Cervical CA screening: Pap smear obtained today.  - Breast CA screening: MMG N/A  - STI testing: see orders below. GC/CT ordered from Pap smear.  - Gardasil series already completed  - Colorectal CA screening: N/A       - Pap smear with HPV reflex, GC, chlamydia, Surepath; Future    2. Screening for STD (sexually transmitted disease)  Safe sex practices discussed in detail. Routine STI screening ordered. Will contact patient with results.     - HIV-1/2 Ag/Ab 4th Generation with Reflex Confirmation; Future  - RPR; Future  - HBS Antigen; Future  - HIV-1/2 Ag/Ab 4th Generation with Reflex Confirmation  - RPR  - HBS Antigen      Jill Ellis has had all questions answered satisfactorily and is in agreement with this recommended plan of care.     Total time in consultation with the patient was approximately 30 minutes, more than 50% time spent on counseling and coordination of care         Author: Nolen Mu 06/21/2019 4:01 PM

## 2019-06-21 NOTE — Addendum Note
Addended by: Thad Ranger on: 06/21/2019 04:13 PM     Modules accepted: Orders

## 2019-06-22 ENCOUNTER — Ambulatory Visit: Payer: PRIVATE HEALTH INSURANCE

## 2019-06-22 DIAGNOSIS — J029 Acute pharyngitis, unspecified: Secondary | ICD-10-CM

## 2019-06-22 DIAGNOSIS — Z0000 Encounter for general adult medical examination without abnormal findings: Secondary | ICD-10-CM

## 2019-06-22 DIAGNOSIS — R5382 Chronic fatigue, unspecified: Secondary | ICD-10-CM

## 2019-06-22 DIAGNOSIS — Z1159 Encounter for screening for other viral diseases: Secondary | ICD-10-CM

## 2019-06-22 LAB — HIV-1/2 Ag/Ab 4th Generation with Reflex Confirmation: HIV-1/2 AG/AB 4TH GENERATION WITH REFLEX CONFIRMATION: NONREACTIVE

## 2019-06-22 MED ORDER — DEXAMETHASONE 1 MG PO TABS
1 mg | ORAL_TABLET | Freq: Once | ORAL | 0 refills | Status: AC
Start: 2019-06-22 — End: ?

## 2019-06-22 NOTE — Patient Instructions
A note from your doctor:    Thank you for choosing the [facility] for your care!    Our plan, as discussed today:    1. Sudafed and salt water gargles for sore throat, try this for three days. If symptoms persist then I would think about treating you for Strep throat given your recent exposure  2. I put in orders for blood tests at the lab. To check cortisol, best to have blood drawn as close to 8am as possible. Take dexamethasone 1 mg around 10 or 11pm the night before you go to the lab    Your return appointment should be scheduled for 1 year.    Please call the office at [phone] if you need to return sooner for any reason.    You will soon be receiving most of your lab results as soon as the lab reports them. As in the past, I will be making comments on them. Please consider waiting 2-3 days for my interpretation, as I will typically wait until all of the results are in before sending you a message.     Please note that I also work at a different site and am not in the [facility] office on Tuesdays all day nor Friday mornings. I will still get messages and phone calls, though it might take longer for me to reply during these times.

## 2019-06-23 LAB — HBs Ag: HEPATITIS B SURFACE ANTIGEN: NONREACTIVE

## 2019-06-23 LAB — Chlamydia trachomatis/Neisseria gonorrhoeae PCR

## 2019-06-23 LAB — RPR: RPR: NONREACTIVE

## 2019-07-04 LAB — Liquid-based pap smear: LAB AP SIGNATURES: NEGATIVE

## 2019-07-19 ENCOUNTER — Ambulatory Visit: Payer: BLUE CROSS/BLUE SHIELD

## 2019-07-24 DIAGNOSIS — R5382 Chronic fatigue, unspecified: Secondary | ICD-10-CM

## 2019-07-25 ENCOUNTER — Ambulatory Visit: Payer: BLUE CROSS/BLUE SHIELD

## 2019-07-25 ENCOUNTER — Ambulatory Visit: Payer: PRIVATE HEALTH INSURANCE

## 2019-07-25 MED ORDER — DEXAMETHASONE 1 MG PO TABS
1 mg | ORAL_TABLET | Freq: Once | ORAL | 0 refills | Status: AC
Start: 2019-07-25 — End: ?

## 2019-09-06 MED ORDER — MONTELUKAST SODIUM 10 MG PO TABS
ORAL_TABLET | 1 refills | Status: AC
Start: 2019-09-06 — End: 2019-09-14

## 2019-09-11 ENCOUNTER — Ambulatory Visit: Payer: PRIVATE HEALTH INSURANCE

## 2019-09-13 DIAGNOSIS — F329 Major depressive disorder, single episode, unspecified: Secondary | ICD-10-CM

## 2019-09-14 ENCOUNTER — Ambulatory Visit: Payer: PRIVATE HEALTH INSURANCE | Attending: Student in an Organized Health Care Education/Training Program

## 2019-09-14 DIAGNOSIS — Z8616 History of COVID-19: Secondary | ICD-10-CM

## 2019-09-14 DIAGNOSIS — K580 Irritable bowel syndrome with diarrhea: Secondary | ICD-10-CM

## 2019-09-14 DIAGNOSIS — B001 Herpesviral vesicular dermatitis: Secondary | ICD-10-CM

## 2019-09-14 DIAGNOSIS — K644 Residual hemorrhoidal skin tags: Secondary | ICD-10-CM

## 2019-09-14 MED ORDER — VALACYCLOVIR HCL 500 MG PO TABS
500 mg | ORAL_TABLET | Freq: Every day | ORAL | 0 refills | Status: AC
Start: 2019-09-14 — End: ?

## 2019-09-14 MED ORDER — ALBUTEROL SULFATE HFA 108 (90 BASE) MCG/ACT IN AERS
2 | Freq: Four times a day (QID) | RESPIRATORY_TRACT | 1 refills | Status: AC | PRN
Start: 2019-09-14 — End: ?

## 2019-09-14 MED ORDER — MONTELUKAST SODIUM 10 MG PO TABS
ORAL_TABLET | 0 refills | Status: AC
Start: 2019-09-14 — End: ?

## 2019-09-14 NOTE — Progress Notes
PATIENT: Jill Ellis  MRN: [id number]  DOB: 04-Feb-1990  DATE OF SERVICE: 09/14/2019    HPI/Assessment/Plan   Jill Ellis is a a 29 y.o. female presenting for   Chief Complaint   Patient presents with   ??? Follow-up     possible hemorrhoids       #Hemorrhoids: discomfort, intermittent small amount of blood. Some blood on TP when wipe. Never filling toilet. FHx of celiac and Crohn's disease, never had EGD/colo in past.     #weight gain: 30lbs in last year. Worsening GERD, feels bloated but no abdominal or pelvic pain. No dyspareunia. TSH wnl. Cortisol ordered, she will obtain.     #IBS: diarrhea predominant. More soft stool recdently. Takes psyllium daily, drinking more water.    #Anxiety: improved. Previously severe sx, and was associatd with diarrhea 10x per day. Now bowel symptoms much improved 1-2x per day. When changing anxiety medications, sx were worse. Now on sertraline, much better controlled, follows with MH.       Diagnoses and all orders for this visit:    Hemorrhoids: 2 external hemorrhoids, inflamed, not thrombosed. Not active bleeding on exam. Discussed Sitz bathes, prep H. Return precautions discussed.     IBS: Diarrhea predominant. Significant FHx. Sx improving with fiber, water, sertraline. Soft formed BMs currently. Encouraged food diary.   -     Referral to Gastroenterology    Depression, unspecified depression type  Anxiety: Stable, improved.     Recurrent herpes labialis  -     valACYclovir 500 mg tablet; Take 1 tablet (500 mg total) by mouth daily.    History of COVID-19  -     albuterol 90 mcg/act inhaler; Inhale 2 puffs every six (6) hours as needed.  -     montelukast 10 mg tablet; TAKE 1 TABLET BY MOUTH EVERY DAY IN THE EVENING.    Health Maintenance Reviewed:  Health Maintenance   Topic Date Due   ??? Influenza Vaccine (1) 09/13/2019   ??? Cervical Ca Screening: PAP Smear  06/21/2022   ??? Tdap/Td Vaccine (2 - Td) 01/22/2028   ??? Hepatitis C Screening  Completed   ??? COVID-19 Vaccine  Completed ??? HIV Screening  Completed   ??? HPV Vaccines  Aged Out     Immunization History   Administered Date(s) Administered   ??? COVID-19 vaccine, vector-nr, [id number], PF, 0.5 mL (JANSSEN) 06/04/2019   ??? Tdap 01/21/2018     Chronic conditions (Reviewed/Updated Problem List):  Patient Active Problem List    Diagnosis Date Noted   ??? Anxiety 05/26/2018   ??? Depression 05/26/2018   ??? Stye 03/14/2018     03/17/18 returns to urgent care because she is worried since the stye ruptured and drained. Reassured her that it will continue to drain over next few days and then heal up. Continue warm compresses and massage. No need for additional oral antibiotics.     Recommended warm compresses, massage and Erythromycin and FML-fluoromethalone - will see oculoplastic  - patient would like to have it remove it       Past Medical History:  Past Medical History:   Diagnosis Date   ??? Anxiety 2011   ??? Asthma 2007   ??? Depression 2011   ??? Eye infection    ??? Headache 2007      Past Surgical History:  Past Surgical History:   Procedure Laterality Date   ??? BREAST SURGERY  2015    breast augmentation   ??? COSMETIC SURGERY  2015  rhinoplasty   ??? SINUS SURGERY  2002    adenoids removed      Medications:  Medications that the patient states to be currently taking   Medication Sig   ??? ADVAIR DISKUS 250-50 MCG/DOSE diskus    ??? albuterol 90 mcg/act inhaler Inhale 2 puffs every six (6) hours as needed.   ??? cotrimoxazole DS 800-160 mg tablet    ??? lisdexamfetamine 20 mg capsule Take 20 mg by mouth daily.   ??? MELATONIN PO Take by mouth.   ??? montelukast 10 mg tablet TAKE 1 TABLET BY MOUTH EVERY DAY IN THE EVENING.   ??? multivitamin tablet Take by mouth daily.   ??? Omega-3 Fatty Acids (FISH OIL PO) Take by mouth.   ??? Probiotic Product (PROBIOTIC PO) Take by mouth.   ??? UNABLE TO FIND Med Name: Adaptogen .   ??? valACYclovir 500 mg tablet Take 1 tablet (500 mg total) by mouth daily.   ??? [DISCONTINUED] albuterol 90 mcg/act inhaler Inhale 2 puffs every six (6) hours as needed.   ? [DISCONTINUED] MONTELUKAST 10 mg tablet TAKE 1 TABLET BY MOUTH EVERY DAY IN THE EVENING   ? [DISCONTINUED] valACYclovir 500 mg tablet Take 1 tablet (500 mg total) by mouth daily.      Allergies:  No Known Allergies  FAMILY HISTORY: family history includes Alcohol abuse in her mother; Arthritis in her mother; Asthma in her brother; Breast cancer in her maternal grandmother; Depression in her brother, father, mother, and sister; Heart attack in her paternal grandfather; Mental illness in her mother; Stroke in her paternal grandmother.   SOCIAL HISTORY:  reports that she has never smoked. She has never used smokeless tobacco.   reports that she has never smoked. She has never used smokeless tobacco. She reports current alcohol use of about 9.6 oz of alcohol per week. She reports current drug use. Drug: Marijuana.     Review of Systems:   (Check if Normal, or note + findings):      []  Not Obtainable:   [x]  Constit  []  Skin    []  Eyes  [x]  CV   []  MS    []  ENT  [x]  GI  []  Heme/Lymph     [x]  Resp  [x]  GU  []  Neuro    []  Imm/All   []  Endo  []  Psych     PHYSICAL EXAM     Vitals:   BP 105/71  ~ Pulse 78  ~ Temp 36.4 ?C (97.5 ?F) (Forehead)  ~ Resp 16  ~ Ht 5' 7'' (1.702 m)  ~ Wt 139 lb (63 kg)  ~ SpO2 97%  ~ BMI 21.77 kg/m?   Body mass index is 21.77 kg/m?Marland Kitchen  Wt Readings from Last 3 Encounters:   09/14/19 139 lb (63 kg)   06/22/19 136 lb 6.4 oz (61.9 kg)   06/21/19 136 lb (61.7 kg)     BP Readings from Last 3 Encounters:   09/14/19 105/71   06/22/19 109/72   06/21/19 (P) 118/82      Checked if normal Additional positive or pertinent negative findings   Constitutional [x]   NAD.    Eyes []   PERRL.   []   Conjunctiva pink.   [x]   Sclera anicteric.     ENT [x]   Hearing intact to voice.   []   Orophyarnyx clear without erythema, exudate, or thrush.  []   Moist mucous membranes    Neck [x]   Supple.   []   Trachea midline.   []   No thyromegaly.    CV []   RRR.   []   No murmurs.   []   JVP <5-cm.   [x]   No lower extremity edema. []   Radial pulses 2+ bilaterally.    []   No carotid bruits appreciated bilaterally on auscultation.    Respiratory []   Lungs clear to auscultation bilaterally.   []   No adventitious breath sounds.   [x]   Good inspiratory effort.    GI [x]   Soft, non-tender, non-distended.   [x]   Normal bowel sounds.   [x]   No hepatosplenomegaly appreciated.    Skin [x]   Extremities warm, dry, well-perfused.   [x]   No jaundice.   []   No rashes.     MSK []   Normal muscle bulk.  []   Motor strength 5/5 throughout all 4 extremities.    []   No clubbing.   []   No cyanosis.     Lymphatics []   No lymphadenopathy of the neck.   []   No lymphadenopathy of the bilateral axilla.    Neurological [x]   CN 2-12 grossly intact.   []   Sensory grossly intact to light touch throughout.   []   Biceps DTR symmetric bilaterally.    Psychiatric [x]   Oriented to person, place, time, and situation.   [x]   Normal mood and affect.     Other findings 2 external hemorrhoid, 1/2 inflammed, not thrombosed. No blood on exam.       LABS/STUDIES   I have:    []  Reviewed/ordered radiology  []  Independently interpretated studies    [x]  Reviewed/ordered labs  []  Reviewed & summarized old records    []  Reviewed/ordered diag med test   []  Decided to get outside medical records      []  Obtained history from someone other than patient    Lab Studies:  Lab Results   Component Value Date    WBC 8.9 07/11/2019    HGB 15.7 (H) 07/11/2019    HCT 45.7 (H) 07/11/2019    PLT 313 07/11/2019     Lab Results   Component Value Date    NA 136 07/11/2019    K 4.5 07/11/2019    CL 102 07/11/2019    CO2 23 07/11/2019    BUN 10 07/11/2019    CREAT 0.67 07/11/2019    GLUCOSE 120 07/11/2019    CALCIUM 9.8 07/11/2019     No results found for: APTT, PT, INR  Lab Results   Component Value Date    ALT 21 07/11/2019    AST 25 07/11/2019    BILITOT 0.5 07/11/2019    ALKPHOS 63 07/11/2019    ALBUMIN 4.8 07/11/2019     Lab Results   Component Value Date    HGBA1C 4.9 07/11/2019     No results found for: CHOL, CHOLHDL, CHOLDLCAL, CHOLDLQ, TRIGLY  No results found for: VITD25OH  No results found for: FE, FERRITIN, FOLATE, RETICCNT, TIBC    Preventive Health Care:       IMMUNIZATIONS:   SCREENING, non-behavioral:  SCREENING, behavioral:    []  Influenza  []  AAA  []  BP    []  Tdap  []  Mammogram  []  Obesity    []  HPV   []  PAP  []  Alcohol    []  Varicella  []  Colonoscopy  []  Smoking cessation    []  PPSV23  []  STIs (HIV, GC, RPR)  []  Diet    []  MMR  []  Lipids  []  Skin cancer    []  Hep A  []  A1c  []   Depression     []  Hep B  []  DEXA  []  STI risk    []  MCV 4  []  Vitamin D  []  Fall prevention    []  Shingrix  []  HCV  [] Domestic violence screen     The above recommendation were discussed with the patient.  The patient has all questions answered satisfactorily and is in agreement with this recommended plan of care.    No follow-ups on file.     No future appointments.    Author:  Modena Morrow. Lillia Abed 09/14/2019 11:19 AM

## 2019-09-14 NOTE — Patient Instructions
A note from your doctor:    Thank you for choosing [facility] for your care.    Our plan, as discussed today:    1. Increase fiber in your diet and drink plenty of water. You can also try Preparation H, witch hazel pads. Also try Sitz bath (you can buy at most pharmacies). Try to wipe with wet wipes only to minimize trauma.   2. Keep a food journal to associate with food triggers and the symptoms you experience.   3. Consider scheduling with IBS specialist such as Dr. Jasper Loser, Dr. Delora Fuel, Dr. Vira Browns, Dr. Eloisa Northern    Your return appointment should be scheduled for No follow-ups on file.    Please call the office at [phone] if you need to return sooner for any reason. My clinic partners for days when I am off-site are:  Kathreen Cornfield and Maia Plan.   ----------------------------------------------------------------------------------------------------------------------    Frequently asked questions:    1. How do I follow through on the plan from my office visit?    ? Written instructions/advice: Stop in our Check-Out room in the clinic before walking out of the clinic to the waiting room. They will print out any written advice from your visit on an ???After Visit Summary.???    ? Laboratory tests: You may show up to any [place] laboratory and provide your name and date of birth and they will be able to find the orders for the lab tests:    o Main laboratory ([address], [address]):   ? Monday to Friday (6:00 am to 7:00 pm)   ? Weekends and holidays (7:00 am to 3:30 pm)  o [facility] [id number] [address])  ? Monday to Friday (8:00 am to 6:00 pm).  o On request, we can send to outside labs (e.g. [facility] or [facility])    ? Imaging studies: General X-Ray can be done same-day on the first floor of our building. Advanced imaging and diagnostic studies generally require insurance review and approval prior to scheduling. The Check-Out staff will provide information for scheduling.    ? Referrals: Stop in the Check-Out room; the staff will verify that the referral has been placed. They will inform you if you can schedule your appointment immediately or if you need to wait for insurance authorization. They will also provide information for scheduling.    ? Follow-up: Stop in the Check-Out room and the staff will schedule your follow-up visit. If you prefer to schedule at a later time, you can call our Call Center at [phone] or request an appointment online through [facility].  If you have seen someone other than your primary care provider for an urgent visit, it is okay to schedule your follow-up with either the doctor who saw you for urgent care or your primary care physician.     ? Outside records requests: If your doctor has indicated that outside records should be requested, please sign the form ???Authorization for Release of Health Information??? at the Check-Out before you leave. We cannot request your personal health records from other doctors/hospitals without your written permission.    2. How do I get my test results from the visit?    ? If you sign up for [facility] online ([email or web address]), we will release test results online with comments once your test results are available, usually within 1 week. Some specialized test results may take several weeks.  ? If you are not signed  up for [facility] when your results become available, we will send your test results by letter within 1-2 weeks of your visit.    ? If an urgent test is ordered in your visit, such as an X-ray to look for a broken bone, our team will give you a call to make sure that you are aware of the results.  ? If another doctor orders a test for you, it is best to speak first with that doctor directly about the result.  ? Please contact our office if you have not received a result in the expected time frame.  ? Please make sure your address and phone number are up-to-date in our system when you check in. We use these to contact you about important health information, including test results.    3. Can I ask questions after the visit?    Yes! It is important to let us know if you have any trouble with the treatment plan that we agree upon or if your symptom(s) are not improving as expected. For non-emergency contact, you may reach your doctor two ways:    ? Online: send non-urgent medical questions through the [facility] website ([email or web address]). These are usually returned within 2 business days.  ? Call the [facility] clinic at [phone] during business hours to leave a message (or schedule a return appointment).  o Our call center staff are trained to gather information that will help your doctor???s office respond to your message quickly and correctly. Please give them as much information as possible to help them transmit your call to the appropriate staff in your doctor???s office.  o Calls will usually be returned on the same business day unless the doctor is out of the office, in which case they will be forwarded to the covering doctor within 1 business day.    4. How do I request a medication refill?    ? If you sign up for [facility] online, you can request refills under the ???Messaging??? section titled ???Request Rx Refill.???  ? You can ask your pharmacy to contact our office directly with a refill request.   ? You can call our Call Center yourself with a refill request.   ? Important guidelines for medication requests:   o New medications generally require an office visit for medical assessment and medication counseling.  o Medication safety monitoring is different for every medication. Please establish a plan with your doctor for the frequency of office visits, laboratory tests, urine screening, EKGs, blood pressure checks, and any other recommended monitoring for your medications. If the monitoring is not up-to-date, you will be asked to come in to the office (or laboratory, if needed) prior to the refill approval.  o Most long-term medications require a visit at least annually to check up on your health.  o Medications prescribed by a specialist may require refills through the office of the specialist.  o DEA scheduled medications cannot be prescribed electronically and require a signed paper prescription from your doctor.  ? DEA Schedule II medications require a primary care visit for each refill request. Please plan ahead to schedule these visits with your personal primary care doctor. Common examples include: opiate medications such as morphine or oxycodone and stimulants such as amphetamine (Adderall).    ? Other DEA Schedule medications (Schedule III, IV, and V) also require regular primary care office visits for refills. Common examples include: tramadol (Ultram), carisoprodol (Soma), clonazepam (Klonopin), alprazolam (Xanax), lorazepam (Ativan), zolpidem (  Ambien), pregabalin (Lyrica), testosterone.      5. How do I get after-hours care?    ZOXWRUEA: We strive to offer same-day appointments in our office Monday-Friday, 8am-5pm. Call during these hours to request an urgent appointment: [phone]      Evenings/weekends: Our colleagues offer access to after-hours care at our walk-in clinic:    [facility]  [address], [place]   [phone]  Monday - Friday: 10:00 am to 9:00 pm  Saturday - Sunday: 9:00 am to 5:00 pm  Laboratory and x-ray services available    The [facility] has additional urgent care sites, see locations at: [email or web address]    Paging: If you need to have your doctor (or the on-call doctor) paged through our emergency line, call [phone].    For serious and life-threatening concerns you should call 911.    Emergency Services    [facility]   Emergency Department  [address]  [place], [place] [address]  Hospital Information: [phone]  Emergency Department: [phone]    [facility]   Emergency Department  [address]  [place], [place] [address]  Hospital Information: [phone]  Emergency Department: [phone]

## 2019-09-15 NOTE — Progress Notes
The patient was seen and evaluated and the care plan was developed jointly with the resident. I have personally evaluated the patient. I agree with the documented findings and plan of care.     Elvira Langston MD MPH  Assistant Professor of Medicine  Pager [phone]  09/15/2019 9:45 AM

## 2019-11-10 MED ORDER — ALBUTEROL SULFATE HFA 108 (90 BASE) MCG/ACT IN AERS
1 refills
Start: 2019-11-10 — End: ?

## 2019-11-15 MED ORDER — ALBUTEROL SULFATE HFA 108 (90 BASE) MCG/ACT IN AERS
1 refills | Status: AC
Start: 2019-11-15 — End: ?

## 2019-11-30 ENCOUNTER — Telehealth: Payer: PRIVATE HEALTH INSURANCE

## 2019-11-30 ENCOUNTER — Ambulatory Visit: Payer: PRIVATE HEALTH INSURANCE

## 2019-11-30 ENCOUNTER — Ambulatory Visit: Payer: BLUE CROSS/BLUE SHIELD | Attending: Gastroenterology

## 2019-11-30 ENCOUNTER — Ambulatory Visit: Payer: BLUE CROSS/BLUE SHIELD

## 2019-11-30 DIAGNOSIS — R140 Abdominal distension (gaseous): Secondary | ICD-10-CM

## 2019-11-30 DIAGNOSIS — K529 Noninfective gastroenteritis and colitis, unspecified: Secondary | ICD-10-CM
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
## 2019-11-30 NOTE — Patient Instructions
1. Blood and stool tests at [facility].     2. Abdominal X-ray to evaluate your stool burden. It's a walk-in study so you don't need to make an appointment. Can drop by any of the following imaging locations to get it done.   [email or web address]    3. EGD and colonoscopy   Usually they will call you to schedule the procedure, but if you don't hear from them within the next few days, please call [phone] to schedule your procedure.     This is a link to the PDF instructions for Miralax, which you can buy over the counter.   [email or web address]

## 2019-11-30 NOTE — Telephone Encounter
[facility] Scheduling Team,    Procedure orders have been placed for patient, please contact patient and further assist in scheduling procedure and providing prep instructions.        Thank you for your time.

## 2020-03-15 ENCOUNTER — Ambulatory Visit: Payer: PRIVATE HEALTH INSURANCE

## 2020-03-15 ENCOUNTER — Telehealth: Payer: PRIVATE HEALTH INSURANCE

## 2020-03-15 ENCOUNTER — Inpatient Hospital Stay: Payer: BLUE CROSS/BLUE SHIELD | Attending: Gastroenterology

## 2020-03-15 DIAGNOSIS — Z01812 Encounter for preprocedural laboratory examination: Secondary | ICD-10-CM

## 2020-03-15 MED ORDER — VALACYCLOVIR HCL 500 MG PO TABS
1000 mg | ORAL_TABLET | Freq: Every day | ORAL | 1 refills | Status: AC
Start: 2020-03-15 — End: ?

## 2020-03-15 NOTE — Telephone Encounter
[facility] Procedure Checklist:     [x]  COVID Order has been pended and sent to MD performing procedure to sign.     [] Open access patient- Pharmacy on file has been confirmed and bowel prep medication has been pended.    Prep instructions have been delivered to patient via:  [] Email to __________  [] Fax to ____________  [] Mail to Address on file  [] Sent via letter on Mychart  [x] Preps provided by the office      Encounter sent to FCU team if the following applies:  [] Procedure scheduled on:  [] Changes have been made to current procedure scheduled.       MAC Script    Patient has been advised of method of anesthesia that will be used in this procedure.  If MAC used, MAC script has been advised.  If IVCS used, pt given option of MAC.     [] Pt has been offered all options and is requesting to be scheduled with MAC. Patient will pay $200 if authorization is denied.    [] Pt has been scheduled in [place] on sedation day (if insurance applicable).      [] Patient has requested to be scheduled with conscious sedation in one of our community Medical procedure units.

## 2020-03-16 ENCOUNTER — Ambulatory Visit: Payer: PRIVATE HEALTH INSURANCE

## 2020-03-25 ENCOUNTER — Telehealth: Payer: PRIVATE HEALTH INSURANCE

## 2020-03-25 NOTE — Telephone Encounter
Call Back Request      Reason for call back:  Per pt called and would like to know if she should still come in for follow up appt even though she couldn't schedule her colonoscopy until May. Please advise and call back at your earliest convenience.     Any Symptoms:  []  Yes  [x]  No       If yes, what symptoms are you experiencing:    o Duration of symptoms (how long):    o Have you taken medication for symptoms (OTC or Rx):      Patient or caller has been notified of the 24-48 hour turnaround time.

## 2020-03-25 NOTE — Telephone Encounter
Hello Dr Juel Burrow,   Just wanted to check with you, would a appointment be okay or did you need a ? There is a slot available on 05/13 which is two days after pt's procedure, was also not sure if it would be to early since her procedure is two days prior on 05/11.   Thank you.   Reply by: Irish Elders

## 2020-03-25 NOTE — Telephone Encounter
Reply by: Belinda Fisher. Juel Burrow  She can reschedule until after her procedures. Please help her reschedule her procedure. Let me know who is the next patient on the waitlist who can take her slot. Thanks.

## 2020-03-25 NOTE — Telephone Encounter
Hello Dr Juel Burrow,   Please advise if pt should be rescheduled until after she has her procedure.   Thank you.   Forwarded by: Irish Elders

## 2020-03-26 NOTE — Telephone Encounter
Reply by: Belinda Fisher. Joss Friedel  2 days after procedure would be too early b/c biopsy results would not be back yet. slot would be okay for her too.

## 2020-04-01 ENCOUNTER — Ambulatory Visit: Payer: PRIVATE HEALTH INSURANCE

## 2020-04-01 NOTE — Telephone Encounter
Attempted to call pt to f/u but no answer. Will send mychart message instead.

## 2020-04-02 ENCOUNTER — Telehealth: Payer: PRIVATE HEALTH INSURANCE

## 2020-04-02 NOTE — Telephone Encounter
Appointment Accommodation Request      Appointment Type: return     Reason for sooner request: follow up from procedure on 05/22/2020     Date/Time Requested (If any): after 05/22/2020     Last seen by MD:  11/30/2019     Any Symptoms:  []  Yes  [x]  No       If yes, what symptoms are you experiencing:   o Duration of symptoms (how long):     Patient or caller was offered an appointment but declined.    Patient or caller was advised to seek emergency services if conditions are urgent or emergent.    Patient has been notified of the 24-48 hour turnaround time.

## 2020-04-04 ENCOUNTER — Ambulatory Visit: Payer: PRIVATE HEALTH INSURANCE | Attending: Gastroenterology

## 2020-04-09 NOTE — Telephone Encounter
Hello,     Please advise.     Thank you

## 2020-04-09 NOTE — Telephone Encounter
Reply by: Belinda Fisher. Sunita Demond  Put her on the waitlist for now and schedule her if someone cancels for May (if it's at least 1 week after 5/11). Let her know and let her know that if nothing opens up, I will overbook her somewhere into May for after our appt.

## 2020-04-10 MED ORDER — VALACYCLOVIR HCL 500 MG PO TABS
1000 mg | ORAL_TABLET | Freq: Every day | ORAL | 2 refills | 10.00000 days
Start: 2020-04-10 — End: ?

## 2020-04-15 ENCOUNTER — Ambulatory Visit: Payer: PRIVATE HEALTH INSURANCE

## 2020-04-15 NOTE — Telephone Encounter
Mychart msg sent - pt on wait list

## 2020-05-15 ENCOUNTER — Telehealth: Payer: PRIVATE HEALTH INSURANCE

## 2020-05-15 NOTE — Telephone Encounter
Confirmation Documentation    (confirmed) procedure(s)   Date:05/22/20  Time: [place]  NOTE:  [facility]: Check in time is 1.5 hours prior to procedure in [address].   [place] MPU: If patient is scheduled at 7am, Check in time is at 6:15am        Colonoscopy  x   Endoscopy  x   Bravo    Cath Placement    Sigmoidoscopy     Small Bowel Entero.    Esophageal Manometry    Anorectal Manometry    Biofeedback    pH study    Capsule Endoscopy    Secretin Infusion Test    Sham Feeding Study        Informed [id number]   1. Of Arrival time (time varies based on location)?Y  2. Of location and room number?Y  3. Make sure there is a confirmed ride for the procedure.Y  4. Has procedure been authorized?  5. Was MAC Script provided?   6. Reviewed Prep instructions with patient? Y/N?Y  7. Has COVID appt been scheduled?Y  8. Scheduled procedure and physician's order match? Y/N?  9. Informed patient to call back with any questions and provided number.Y/N?Y    NOTE: If patients have any questions regarding prescribed medication patient needs to contact their prescribing physician to ensure it is ok to stop medications.      Comments/Notes:        If VM is left and pt calls back, please warm transfer patient to [phone]. This number is for internal use only and is not to be provided to patients.

## 2020-05-20 ENCOUNTER — Ambulatory Visit: Payer: PRIVATE HEALTH INSURANCE

## 2020-05-20 ENCOUNTER — Ambulatory Visit: Payer: BLUE CROSS/BLUE SHIELD

## 2020-05-21 ENCOUNTER — Ambulatory Visit: Payer: PRIVATE HEALTH INSURANCE

## 2020-06-14 MED ORDER — VALACYCLOVIR HCL 500 MG PO TABS
1000 mg | ORAL_TABLET | Freq: Every day | ORAL | 2 refills | Status: AC
Start: 2020-06-14 — End: ?

## 2020-07-18 ENCOUNTER — Inpatient Hospital Stay: Payer: PRIVATE HEALTH INSURANCE | Attending: Gastroenterology

## 2020-07-18 DIAGNOSIS — K529 Noninfective gastroenteritis and colitis, unspecified: Secondary | ICD-10-CM

## 2020-07-18 DIAGNOSIS — R140 Abdominal distension (gaseous): Secondary | ICD-10-CM

## 2020-07-22 ENCOUNTER — Non-Acute Institutional Stay: Payer: BLUE CROSS/BLUE SHIELD

## 2020-07-22 ENCOUNTER — Ambulatory Visit: Payer: BLUE CROSS/BLUE SHIELD

## 2020-07-23 ENCOUNTER — Telehealth: Payer: PRIVATE HEALTH INSURANCE

## 2020-07-23 NOTE — Telephone Encounter
Appointment Accommodation Request      Appointment Type: Return     Reason for sooner request: Patient colonoscopy is on 09/20/20 and they want to meet with Dr.Lin to review the results.     Date/Time Requested (If any): After 09/20/20     Last seen by MD: 11/30/19    Any Symptoms:  []  Yes  [x]  No       If yes, what symptoms are you experiencing:   o Duration of symptoms (how long):     Patient or caller was offered an appointment but declined.    Patient or caller was advised to seek emergency services if conditions are urgent or emergent.    Patient has been notified of the 24-48 hour turnaround time.

## 2020-08-27 ENCOUNTER — Non-Acute Institutional Stay: Payer: PRIVATE HEALTH INSURANCE | Attending: Gastroenterology

## 2020-09-17 ENCOUNTER — Ambulatory Visit: Payer: BLUE CROSS/BLUE SHIELD

## 2020-09-19 ENCOUNTER — Non-Acute Institutional Stay: Payer: BLUE CROSS/BLUE SHIELD | Attending: Ophthalmic Plastic and Reconstructive Surgery

## 2020-09-30 MED ORDER — VALACYCLOVIR HCL 500 MG PO TABS
ORAL_TABLET | 3 refills | Status: AC
Start: 2020-09-30 — End: ?

## 2020-10-01 ENCOUNTER — Non-Acute Institutional Stay: Payer: BLUE CROSS/BLUE SHIELD | Attending: Gastroenterology

## 2020-10-08 ENCOUNTER — Telehealth: Payer: PRIVATE HEALTH INSURANCE

## 2020-10-08 ENCOUNTER — Ambulatory Visit: Payer: PRIVATE HEALTH INSURANCE

## 2020-10-08 NOTE — Telephone Encounter
Confirmation Documentation   (left msg)  procedure(s)   Date:10/15/20  Time: 0130  Check-in Time:1230  NOTE:  [facility]: Check in time is 1.5 hours prior to procedure in [address].   [place] MPU: If patient is scheduled at 7am, Check in time is at 6:15am        Colonoscopy  x   Endoscopy  x   Bravo    Cath Placement    Sigmoidoscopy     Small Bowel Entero.    Esophageal Manometry    Anorectal Manometry    Biofeedback    pH study    Capsule Endoscopy    Secretin Infusion Test    Sham Feeding Study        Informed pt:  1. Of Arrival time (time varies based on location)?  2. Of location and room number?  3. Make sure there is a confirmed ride for the procedure.  4. Has procedure been authorized?  5. Was MAC Script provided?   6. Reviewed Prep instructions with patient? Y/N?  7. Has COVID appt been scheduled?  8. Scheduled procedure and physician's order match? Y/N?  9. Informed patient to call back with any questions and provided number.Y/N?    NOTE: If patients have any questions regarding prescribed medication patient needs to contact their prescribing physician to ensure it is ok to stop medications.      Comments/Notes:        If VM is left and pt calls back, please warm transfer patient to [phone]. This number is for internal use only and is not to be provided to patients.

## 2020-10-11 ENCOUNTER — Ambulatory Visit: Payer: BLUE CROSS/BLUE SHIELD

## 2020-10-14 ENCOUNTER — Telehealth: Payer: PRIVATE HEALTH INSURANCE

## 2020-10-14 NOTE — Telephone Encounter
Spoke with pt to obtain pre procedure covid test for 10/15/20 procedure.  Per pt, has not tested for covid and would like to reschedule. Pt call has been transferred to MPU to assist with rescheduling.

## 2020-12-12 ENCOUNTER — Telehealth: Payer: BLUE CROSS/BLUE SHIELD

## 2020-12-12 ENCOUNTER — Ambulatory Visit: Payer: PRIVATE HEALTH INSURANCE

## 2020-12-12 DIAGNOSIS — R140 Abdominal distension (gaseous): Secondary | ICD-10-CM

## 2020-12-12 DIAGNOSIS — R197 Diarrhea, unspecified: Secondary | ICD-10-CM

## 2020-12-12 MED ORDER — NA SULFATE-K SULFATE-MG SULF 17.5-3.13-1.6 GM/177ML PO SOLN
0 refills | 1.00000 days | Status: AC
Start: 2020-12-12 — End: ?

## 2020-12-12 NOTE — Telephone Encounter
Hi Dr. Juel Burrow,    I called pt and LVM relaying message.    Thank you    [employer]

## 2020-12-12 NOTE — Telephone Encounter
Please advise 

## 2020-12-12 NOTE — Telephone Encounter
Confirmation Documentation   (left msg)  Date: 12/18/20  Time: 2:30PM  Check-in Time: 1:30PM  NOTE:  [facility]: Check in time is 1.5 hours prior to procedure in [address].   [place] MPU: If patient is scheduled at 7am, Check in time is at 6:15am        Colonoscopy  X   Endoscopy  X   Bravo    Cath Placement    Sigmoidoscopy     Small Bowel Entero.    Esophageal Manometry    Anorectal Manometry    Biofeedback    pH study    Capsule Endoscopy    Secretin Infusion Test    Sham Feeding Study        Informed pt:  1. Of Arrival time (time varies based on location)?  2. Of location and room number?  3. Make sure there is a confirmed ride for the procedure.  4. Has procedure been authorized?Y  5. Was MAC Script provided?   6. Reviewed Prep instructions with patient? Y/N?  7. Has COVID appt been scheduled? N  8. Scheduled procedure and physician's order match? Y/N?Y  9. Informed patient to call back with any questions and provided number.Y/N?    NOTE: If patients have any questions regarding prescribed medication patient needs to contact their prescribing physician to ensure it is ok to stop medications.      Comments/Notes:        If VM is left and pt calls back, please warm transfer patient to [phone]. This number is for internal use only and is not to be provided to patients.

## 2020-12-12 NOTE — Telephone Encounter
Orders Request    What is being requested? (Tests, Labs, Imaging, etc.):  Colonoscopy and Endoscopy     Reason for the request: Her current order which is scheduled for 12/18/20 expired on 12/02/20.    Where does the patient want to be seen?  Patient is scheduled in [facility]     If outside [facility], what is the fax number to the facility?      Has the patient seen their doctor for this matter? yes    Last office visit: n/a

## 2020-12-13 ENCOUNTER — Telehealth: Payer: BLUE CROSS/BLUE SHIELD

## 2020-12-13 ENCOUNTER — Non-Acute Institutional Stay: Payer: PRIVATE HEALTH INSURANCE | Attending: Gastroenterology

## 2020-12-13 NOTE — Telephone Encounter
[facility] Scheduling Team,    Procedure orders have been placed for patient, please contact patient and further assist in scheduling procedure and providing prep instructions.        Thank you for your time.

## 2020-12-13 NOTE — Telephone Encounter
Left vm to call office

## 2020-12-16 ENCOUNTER — Non-Acute Institutional Stay: Payer: PRIVATE HEALTH INSURANCE

## 2020-12-17 ENCOUNTER — Telehealth: Payer: BLUE CROSS/BLUE SHIELD

## 2020-12-17 ENCOUNTER — Ambulatory Visit: Payer: BLUE CROSS/BLUE SHIELD

## 2020-12-18 NOTE — Telephone Encounter
Unable to leave vm. If patient calls back, please assist with rescheduling procedure. Thank you.

## 2020-12-18 NOTE — H&P
Procedure was cancelled.

## 2020-12-18 NOTE — Discharge Instructions
PROCEDURE: [x] Upper endoscopy (EGD)   [x] Colonoscopy    [] Sigmoidoscopy    FINDINGS:   See formal procedure report provided for details    ACTIVITY:    [x] Avoid any strenuous physical activity for 24 hours    [x] No driving or operating heavy machinery for 24 hours    [x] No alcoholic beverages for 24 hours (may interact with some medications you received during your procedure)    DIET:    [x] Resume your usual diet    [] Specific diet instructions:     Call [phone] (during office hours) and ask for Dr. Marina Boerner or [place] page operator at [phone] (after hours for on-call GI) if you experience any of the following:      [x] Severe chest pain, difficulty breathing    [x] Passage of blood clots, black tarry stools, vomiting blood    [x] Severe inflammation at the I.V. site    [x] Difficulty swallowing or persistent vomiting    [x] Abdominal pain    [x] Chills or fever greater than 101 F or 38.4 C within 24 hours of procedure    FOLLOW-UP CARE: Follow up with your primary care physician as planned. If you have any questions about your procedure please feel free to call my office. If applicable, I will send you a letter with the biopsy / pathology results in approximately 7-10 days.

## 2022-07-07 ENCOUNTER — Ambulatory Visit: Payer: PRIVATE HEALTH INSURANCE

## 2022-07-07 DIAGNOSIS — N939 Abnormal uterine and vaginal bleeding, unspecified: Secondary | ICD-10-CM

## 2022-07-07 NOTE — Patient Instructions
Birth Control: IUD (Intrauterine Device)  The IUD (intrauterine device) is small, flexible, and T-shaped. A trained healthcare provider places it in the uterus. The IUD is one of the most effective birth control methods. It's also reversible. This means it can be removed at any time by a trained healthcare provider. New IUDs are safe and don't have the risks of older types of IUDs.     Pregnancy rates  Talk to your healthcare provider about the effectiveness of this birth control method.  Types of IUDs  IUD insertion is done in the healthcare provider?s office. Two types of IUDs are available:  The copper IUD releases a small amount of copper into the uterus. The copper makes it harder for sperm to reach the egg. The device works for at least 10 years.  The progestin IUD releases a hormone called progestin. It causes changes in the uterus to help prevent pregnancy. The device works for 3 to 8 years, depending on which device is chosen. It may be recommended for women who have anemia or heavy and painful periods.  IUDs have thin strings that hang from the opening of the uterus into the vagina. This lets you check that the IUD stays in place.   Things to know about IUDs  IUDs can be used by women who have never been pregnant or by women with a history of sexually transmitted infections (STIs) or tubal pregnancy.  It won't move from the uterus to any other part of the body.  There is a slight risk of the device coming out of the vagina (expulsion).  It may not work in women who have an abnormally shaped uterus.  A copper IUD may cause heavier periods and cramping.  A progestin IUD may cause light periods or no periods at all (irregular bleeding or spotting is possible and normal during the first 3 to 6 months).  If you get a sexually transmitted infection with an IUD in place, symptoms may be more severe.    What to report to your healthcare provider  Be sure your healthcare provider knows if you have:  A sexually transmitted infection (STI) or possible STI  Liver problems  Blood clots (for progestin IUD only)  Breast cancer or a history of breast cancer (progestin IUD only)  [employer] last reviewed this educational content on 12/12/2020  ? 2000-2023 [employer]. All rights reserved. This information is not intended as a substitute for professional medical care. Always follow your healthcare professional's instructions.

## 2022-07-07 NOTE — Progress Notes
[place]    Urgent Care Progress Note        Patient:  Jill Ellis  Medical record number:  [id number]  Date of birth:  1990/11/19  Date of service:  07/07/2022    Chief Complaint   Patient presents with    Foreign Body in Vagina     C/o poss tampon stuck from Sunday.     Abdominal Cramping     Lower Abd discomfort       BP 133/90  ~ Pulse 87  ~ Temp 36.5 ?C (97.7 ?F) (Temporal)  ~ Resp 16  ~ Ht 5' 7'' (1.702 m)  ~ Wt 140 lb (63.5 kg)  ~ LMP 07/05/2022  ~ SpO2 99%  ~ BMI 21.93 kg/m?     SUBJECTIVE:     Jill Ellis is a 32 y.o. female who presents for :    Possible tampon stuck in vagina  Had a serious illness when in college.     IUD for about 4 monnths   Menses have been heavy for about 5 days since the copper IUD so grew concerned when went from normal period to just spotting in a couple days.        Patient Active Problem List   Diagnosis    Stye    Anxiety    Depression       Medication:  Medications that the patient states to be currently taking   Medication Sig    DEXMETHYLPHENIDATE HCL ER PO Take 20 mg by mouth daily. Max Daily Amount: 20 mg    VALACYCLOVIR 500 mg tablet TAKE 2 TABLETS BY MOUTH EVERY DAY       Allergies: No Known Allergies    Social History     Tobacco Use    Smoking status: Never    Smokeless tobacco: Never    Tobacco comments:     tried once or twice, hated it   Substance Use Topics    Alcohol use: Yes     Alcohol/week: 9.6 oz     Types: 14 Glasses of Wine (5 oz), 2 Shots of Liquor (1.5 oz) per week     Comment: binge episodes on weekends          Family History   Problem Relation Age of Onset    Alcohol abuse Mother     Arthritis Mother         rheumatoid arthritis    Depression Mother     Mental illness Mother         bipolar 2 / mania    Asthma Brother     Depression Brother     Depression Father     Depression Sister     Stroke Paternal Grandmother     Breast cancer Maternal Grandmother     Heart attack Paternal Grandfather        Review of Systems: see HPI    PHYSICAL EXAM:     BP 133/90  ~ Pulse 87  ~ Temp 36.5 ?C (97.7 ?F) (Temporal)  ~ Resp 16  ~ Ht 5' 7'' (1.702 m)  ~ Wt 140 lb (63.5 kg)  ~ LMP 07/05/2022  ~ SpO2 99%  ~ BMI 21.93 kg/m?   Gen: Alert & cooperative female, appears well and in no acute distress.    Exam with chaperone [place] Dec  Normal ext female gen,   Several small clots in vaginal vault, IUD string in place, no FB seen      STUDIES:  Relevant labs and imaging were reviewed today by me in conjunction with the patient.    Orders Only on 03/16/2020   Component Date Value Ref Range Status    Education Title 03/16/2020 [facility] - SELF ADMINISTERED MID-TURBINATE NASAL SPECIMEN COLLECTION FOR COVID-19   Incomplete    Emmi Access URL 03/16/2020 [email or web address]   Incomplete    Emmi Access Code 03/16/2020 [id number]   Incomplete    Emmi Issue Date 03/16/2020 Mar 16, 2020   Incomplete    Emmi Expiration Date 03/16/2020 Apr 14, 2020   Incomplete    Emmi Message Event 03/16/2020 Scheduled   Incomplete           ASSESSMENT & PLAN:       Diagnoses and all orders for this visit:    Uterine bleeding        Patient Instructions        Birth Control: IUD (Intrauterine Device)  The IUD (intrauterine device) is small, flexible, and T-shaped. A trained healthcare provider places it in the uterus. The IUD is one of the most effective birth control methods. It's also reversible. This means it can be removed at any time by a trained healthcare provider. New IUDs are safe and don't have the risks of older types of IUDs.     Pregnancy rates  Talk to your healthcare provider about the effectiveness of this birth control method.  Types of IUDs  IUD insertion is done in the healthcare provider?s office. Two types of IUDs are available:  The copper IUD releases a small amount of copper into the uterus. The copper makes it harder for sperm to reach the egg. The device works for at least 10 years.  The progestin IUD releases a hormone called progestin. It causes changes in the uterus to help prevent pregnancy. The device works for 3 to 8 years, depending on which device is chosen. It may be recommended for women who have anemia or heavy and painful periods.  IUDs have thin strings that hang from the opening of the uterus into the vagina. This lets you check that the IUD stays in place.   Things to know about IUDs  IUDs can be used by women who have never been pregnant or by women with a history of sexually transmitted infections (STIs) or tubal pregnancy.  It won't move from the uterus to any other part of the body.  There is a slight risk of the device coming out of the vagina (expulsion).  It may not work in women who have an abnormally shaped uterus.  A copper IUD may cause heavier periods and cramping.  A progestin IUD may cause light periods or no periods at all (irregular bleeding or spotting is possible and normal during the first 3 to 6 months).  If you get a sexually transmitted infection with an IUD in place, symptoms may be more severe.     What to report to your healthcare provider  Be sure your healthcare provider knows if you have:  A sexually transmitted infection (STI) or possible STI  Liver problems  Blood clots (for progestin IUD only)  Breast cancer or a history of breast cancer (progestin IUD only)  StayWell last reviewed this educational content on 12/12/2020  ? 2000-2023 [employer], [place]. All rights reserved. This information is not intended as a substitute for professional medical care. Always follow your healthcare professional's instructions.  Return for follow up with your PCP or Gyn.    Return to clinic prn if these symptoms worsen or fail to improve as anticipated.     The above plan was explained and reviewed extensively with the patient.  All questions were answered.     Claretha Cooper, MD  [facility]

## 2023-02-27 ENCOUNTER — Ambulatory Visit: Payer: PRIVATE HEALTH INSURANCE

## 2023-02-27 DIAGNOSIS — L309 Dermatitis, unspecified: Secondary | ICD-10-CM

## 2023-02-27 MED ORDER — TRIAMCINOLONE ACETONIDE 0.5 % EX OINT
Freq: Two times a day (BID) | TOPICAL | 1 refills | Status: AC
Start: 2023-02-27 — End: ?

## 2023-02-27 NOTE — Progress Notes
 [place]    Urgent Care Progress Note        Patient:  Jill Ellis  Medical record number:  [id number]  Date of birth:  1990-09-19  Date of service:  02/27/2023    Chief Complaint   Patient presents with    Mouth Lesions     Left palm and left top of foot  Sores are itchy   History of HSV-1       BP 111/72  ~ Pulse 89  ~ Temp 37.2 ?C (98.9 ?F) (Forehead)  ~ Resp 16  ~ SpO2 96%     SUBJECTIVE:     Jill Ellis is a 33 y.o. 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   STATES HELPED TO KEEP AT BAY  BUT STILL VERY ITCHY  Was hoping to get an IV antiviral here.     WAS INTERESTED IN POSSIBLY IV ANTIVIRAL.        Patient Active Problem List   Diagnosis    Stye    Anxiety    Depression       Medication:  No outpatient medications have been marked as taking for the 02/27/23 encounter (Office Visit) with Teodora Medici., MD.       Allergies: No Known Allergies    Social History     Tobacco Use    Smoking status: Never    Smokeless tobacco: Never    Tobacco comments:     tried once or twice, hated it   Substance Use Topics    Alcohol use: Yes     Alcohol/week: 9.6 oz     Types: 14 Glasses of Wine (5 oz), 2 Shots of Liquor (1.5 oz) per week     Comment: binge episodes on weekends          Family History   Problem Relation Age of Onset    Alcohol abuse Mother     Arthritis Mother         rheumatoid arthritis    Depression Mother     Mental illness Mother         bipolar 2 / mania    Asthma Brother     Depression Brother     Depression Father     Depression Sister     Stroke Paternal Grandmother     Breast cancer Maternal Grandmother     Heart attack Paternal Grandfather        Review of Systems: see HPI    PHYSICAL EXAM:     BP 111/72  ~ Pulse 89  ~ Temp 37.2 ?C (98.9 ?F) (Forehead)  ~ Resp 16  ~ SpO2 96%   Gen: Alert & cooperative female, appears well and in no acute distress.    Left palm appearance of possible bruise with redness    Left dorsal ankle roundish red rash      STUDIES:     Relevant labs and imaging were reviewed today by me in conjunction with the patient.    Orders Only on 03/16/2020   Component Date Value Ref Range Status    Education Title 03/16/2020 [facility] - SELF ADMINISTERED MID-TURBINATE NASAL SPECIMEN COLLECTION FOR COVID-19   Incomplete    Emmi Access URL 03/16/2020 [email or web address]   Incomplete    Emmi Access Code 03/16/2020 [id number]   Incomplete  Emmi Issue Date 03/16/2020 Mar 16, 2020   Incomplete    Emmi Expiration Date 03/16/2020 Apr 14, 2020   Incomplete    Emmi Message Event 03/16/2020 Scheduled   Incomplete           ASSESSMENT & PLAN:   Clinically does not have appearance of herpetic lesion at this time.    Patient concerned may be blister up and spread.       Diagnoses and all orders for this visit:    Dermatitis  -     Referral to Dermatology  -     triamcinolone 0.5% ointment; Apply topically two (2) times daily for 7 days.        Patient Instructions     Contact Dermatitis   Contact dermatitis is a skin rash caused by something that touches the skin and makes it irritated and inflamed. Your skin may be red, swollen, dry, and cracked. Blisters may form and ooze. The rash will itch.   Contact dermatitis often forms on the face and neck, backs of hands, forearms, genitals, and lower legs. But it can affect any area.   People can get contact dermatitis from lots of sources. These include:  Plants, such as poison ivy, oak, or sumac  Chemicals in hair dyes and rinses, soaps, solvents, waxes, fingernail polish, and deodorants   Jewelry or watchbands made of nickel or cobalt  Rubber (including latex)  Fragrances (found in perfumes, lipsticks, makeup, and soaps)  Contact dermatitis is not passed from person to person.  Talk with your healthcare provider about what may have caused the rash. Patch testing, a type of allergy testing, may be used to discover what you are allergic to. You will need to stay away from the source of the rash in the future to prevent it from coming back.   Treatment is done to ease itching and prevent the rash from coming back. The rash should go away in a few days to a few weeks.   Home care  Your healthcare provider may prescribe medicine to ease swelling and itching. Follow all instructions when using these medicines.   General care  Stay away from anything that heats up your skin, such as hot showers or baths, or direct sunlight. This can make itching worse.  Apply cold compresses to soothe your sores to help ease your symptoms. Do this for 30 minutes 3 to 4 times a day. You can make a cold compress by soaking a cloth in cold water. Squeeze out excess water. You can add colloidal oatmeal to the water to help reduce itching. For severe itching in a small area, apply an ice pack wrapped in a thin towel. Do this for 20 minutes 3 to 4 times a day.  You can also try wet dressings. One way to do this is to wear a wet piece of clothing under a dry one. Wear a damp shirt under a dry shirt if your upper body is affected. This can relieve itching and prevent you from scratching the affected area.  You can also help ease large areas of itching by taking a lukewarm bath with colloidal oatmeal added to the water.  Use hydrocortisone cream for redness and irritation, unless another medicine was prescribed. Calamine lotion can also relieve mild symptoms.  Use oral diphenhydramine to help reduce itching. You can buy this antihistamine at pharmacies and grocery stores. It can make you sleepy, so use lower doses during the daytime. Don't use diphenhydramine if you  have glaucoma or have trouble urinating because of an enlarged prostate.  If a plant causes your rash, make sure to wash your skin and the clothes you were wearing when you came into contact with the plant. This is to wash away the plant oils that gave you the rash and prevent more or worse symptoms. If you have a pet that's been outdoors, its fur may also have oil from the plant. Bathe your pet with soap or shampoo.  Stay away from the substance or object that causes your symptoms. If you can?t stay away from it, wear gloves or some other type of protection    Follow-up care  Follow up with your healthcare provider, or as advised.  When to get medical advice  Call your healthcare provider right away if any of these occur:   Spreading of the rash to other parts of your body  Severe swelling of your face, eyelids, mouth, throat, or tongue  Trouble peeing due to swelling in the genital area  Fever of 100.4?F (38?C) or higher, or as advised by your provider  Redness or swelling that gets worse  Pain that gets worse  Bad-smelling fluid leaking from the skin  Yellow-brown crusts on the open blisters  StayWell last reviewed this educational content on 02/13/2020  ? 2000-2023 [employer], [place]. All rights reserved. This information is not intended as a substitute for professional medical care. Always follow your healthcare professional's instructions.          Return if symptoms worsen or fail to improve.    Return to clinic prn if these symptoms worsen or fail to improve as anticipated.     The above plan was explained and reviewed extensively with the patient.  All questions were answered.     Claretha Cooper, MD  [facility]

## 2023-02-27 NOTE — Patient Instructions
 Contact Dermatitis   Contact dermatitis is a skin rash caused by something that touches the skin and makes it irritated and inflamed. Your skin may be red, swollen, dry, and cracked. Blisters may form and ooze. The rash will itch.   Contact dermatitis often forms on the face and neck, backs of hands, forearms, genitals, and lower legs. But it can affect any area.   People can get contact dermatitis from lots of sources. These include:  Plants, such as poison ivy, oak, or sumac  Chemicals in hair dyes and rinses, soaps, solvents, waxes, fingernail polish, and deodorants   Jewelry or watchbands made of nickel or cobalt  Rubber (including latex)  Fragrances (found in perfumes, lipsticks, makeup, and soaps)  Contact dermatitis is not passed from person to person.  Talk with your healthcare provider about what may have caused the rash. Patch testing, a type of allergy testing, may be used to discover what you are allergic to. You will need to stay away from the source of the rash in the future to prevent it from coming back.   Treatment is done to ease itching and prevent the rash from coming back. The rash should go away in a few days to a few weeks.   Home care  Your healthcare provider may prescribe medicine to ease swelling and itching. Follow all instructions when using these medicines.   General care  Stay away from anything that heats up your skin, such as hot showers or baths, or direct sunlight. This can make itching worse.  Apply cold compresses to soothe your sores to help ease your symptoms. Do this for 30 minutes 3 to 4 times a day. You can make a cold compress by soaking a cloth in cold water. Squeeze out excess water. You can add colloidal oatmeal to the water to help reduce itching. For severe itching in a small area, apply an ice pack wrapped in a thin towel. Do this for 20 minutes 3 to 4 times a day.  You can also try wet dressings. One way to do this is to wear a wet piece of clothing under a dry one. Wear a damp shirt under a dry shirt if your upper body is affected. This can relieve itching and prevent you from scratching the affected area.  You can also help ease large areas of itching by taking a lukewarm bath with colloidal oatmeal added to the water.  Use hydrocortisone cream for redness and irritation, unless another medicine was prescribed. Calamine lotion can also relieve mild symptoms.  Use oral diphenhydramine to help reduce itching. You can buy this antihistamine at pharmacies and grocery stores. It can make you sleepy, so use lower doses during the daytime. Don't use diphenhydramine if you have glaucoma or have trouble urinating because of an enlarged prostate.  If a plant causes your rash, make sure to wash your skin and the clothes you were wearing when you came into contact with the plant. This is to wash away the plant oils that gave you the rash and prevent more or worse symptoms. If you have a pet that's been outdoors, its fur may also have oil from the plant. Bathe your pet with soap or shampoo.  Stay away from the substance or object that causes your symptoms. If you can?t stay away from it, wear gloves or some other type of protection    Follow-up care  Follow up with your healthcare provider, or as advised.  When to get medical  advice  Call your healthcare provider right away if any of these occur:   Spreading of the rash to other parts of your body  Severe swelling of your face, eyelids, mouth, throat, or tongue  Trouble peeing due to swelling in the genital area  Fever of 100.4?F (38?C) or higher, or as advised by your provider  Redness or swelling that gets worse  Pain that gets worse  Bad-smelling fluid leaking from the skin  Yellow-brown crusts on the open blisters  StayWell last reviewed this educational content on 02/13/2020  ? 2000-2023 [employer], [place]. All rights reserved. This information is not intended as a substitute for professional medical care. Always follow your healthcare professional's instructions.
